# Patient Record
Sex: Female | Born: 1945 | Race: White | Hispanic: No | Smoking: Never smoker
Health system: Southern US, Community
[De-identification: ages and names within clinical notes are randomized; demographics above are authoritative.]

## PROBLEM LIST (undated history)

## (undated) DIAGNOSIS — M810 Age-related osteoporosis without current pathological fracture: Secondary | ICD-10-CM

## (undated) DIAGNOSIS — R87619 Unspecified abnormal cytological findings in specimens from cervix uteri: Secondary | ICD-10-CM

## (undated) DIAGNOSIS — N952 Postmenopausal atrophic vaginitis: Secondary | ICD-10-CM

## (undated) DIAGNOSIS — B009 Herpesviral infection, unspecified: Secondary | ICD-10-CM

## (undated) HISTORY — DX: Postmenopausal atrophic vaginitis: N95.2

## (undated) HISTORY — DX: Herpesviral infection, unspecified: B00.9

## (undated) HISTORY — DX: Age-related osteoporosis without current pathological fracture: M81.0

## (undated) HISTORY — DX: Unspecified abnormal cytological findings in specimens from cervix uteri: R87.619

## (undated) HISTORY — PX: ANKLE ARTHROSCOPY: SHX545

---

## 2000-01-29 ENCOUNTER — Ambulatory Visit (HOSPITAL_BASED_OUTPATIENT_CLINIC_OR_DEPARTMENT_OTHER): Admission: RE | Admit: 2000-01-29 | Discharge: 2000-01-29 | Payer: Self-pay | Admitting: *Deleted

## 2008-05-18 ENCOUNTER — Other Ambulatory Visit: Admission: RE | Admit: 2008-05-18 | Discharge: 2008-05-18 | Payer: Self-pay | Admitting: Obstetrics and Gynecology

## 2008-12-28 HISTORY — PX: COLONOSCOPY W/ POLYPECTOMY: SHX1380

## 2010-11-15 NOTE — Op Note (Signed)
[place]. [facility]  Patient:    Adriana Cook, Adriana Cook                      MRN: [id number] Proc. Date: 01/29/00 Attending:  Janet Berlin. Dan Humphreys, M.D.                           Operative Report  PREOPERATIVE DIAGNOSIS:  Basal cell carcinoma of the lower mid forehead.  POSTOPERATIVE DIAGNOSIS:  Basal cell carcinoma of the lower mid forehead.  OPERATION PERFORMED:  Excisional biopsy of approximately 8-9 mm basal cell carcinoma of the lower mid forehead.  SURGEON:  Janet Berlin. Dan Humphreys, M.D.  FIRST ASSISTANT:  None.  ANESTHESIA:  Local.  INDICATIONS:  [name] is a 65 year old woman who presents with a basal cell carcinoma just above the glabellar area of the lower mid forehead.  She is taken to the operating room at this time for excisional biopsy.  DESCRIPTION OF PROCEDURE:  The patient was brought back to the minor surgery operating room in the [facility].  She was placed on the table in a supine position.  The forehead area was sterilely prepped and draped.  I designed an elliptically shaped excision with a sterile surgical marking pen to include grossly clear margins around the lesion.  Soft tissues were then infiltrated with 1% Xylocaine containing epinephrine.  The lesion was then excised full thickness (including skin and underlying subcutaneous tissue).  I passed the specimen off the operative field.  Just prior to doing this, I had oriented the tissue for the pathologist by placing a suture in its right lateral margin.  After individually controlling bleeding points with the battery powered electrocautery, the wound edges were reapproximated with interrupted, buried Dermal sutures of 5-0 Vicryl.  I then used 6-0 monofilament with nylon in both running and vertical mattress fashions to provide for final epidermal leveling.  The wound was sterilely dressed.  The patient was given wound care instructions, and we will see her back in the  office early next week for a wound check, suture removal, and review of her final pathology report. DD:  01/29/00 TD:  01/30/00 Job: [id number]

## 2011-02-14 HISTORY — PX: CATARACT EXTRACTION: SUR2

## 2011-07-24 ENCOUNTER — Other Ambulatory Visit: Payer: Self-pay | Admitting: Dermatology

## 2012-04-12 HISTORY — PX: COLONOSCOPY W/ POLYPECTOMY: SHX1380

## 2012-04-12 LAB — HM COLONOSCOPY: HM Colonoscopy: ABNORMAL

## 2012-05-11 ENCOUNTER — Other Ambulatory Visit: Payer: Self-pay | Admitting: Dermatology

## 2013-03-01 DIAGNOSIS — Z961 Presence of intraocular lens: Secondary | ICD-10-CM | POA: Diagnosis not present

## 2013-03-01 DIAGNOSIS — H2510 Age-related nuclear cataract, unspecified eye: Secondary | ICD-10-CM | POA: Diagnosis not present

## 2013-03-01 DIAGNOSIS — H5231 Anisometropia: Secondary | ICD-10-CM | POA: Diagnosis not present

## 2013-03-01 DIAGNOSIS — H52229 Regular astigmatism, unspecified eye: Secondary | ICD-10-CM | POA: Diagnosis not present

## 2013-08-09 ENCOUNTER — Encounter: Payer: Self-pay | Admitting: Obstetrics and Gynecology

## 2013-08-12 ENCOUNTER — Ambulatory Visit: Payer: Self-pay | Admitting: Obstetrics and Gynecology

## 2013-08-12 ENCOUNTER — Telehealth: Payer: Self-pay | Admitting: Obstetrics and Gynecology

## 2013-08-12 NOTE — Telephone Encounter (Signed)
Thank you :)

## 2013-08-12 NOTE — Telephone Encounter (Signed)
Pt cancel for aex today . She had an family emergency I rescheduled her to 09/08/13 with PGrubb.

## 2013-09-02 DIAGNOSIS — L819 Disorder of pigmentation, unspecified: Secondary | ICD-10-CM | POA: Diagnosis not present

## 2013-09-02 DIAGNOSIS — D239 Other benign neoplasm of skin, unspecified: Secondary | ICD-10-CM | POA: Diagnosis not present

## 2013-09-02 DIAGNOSIS — L821 Other seborrheic keratosis: Secondary | ICD-10-CM | POA: Diagnosis not present

## 2013-09-02 DIAGNOSIS — D485 Neoplasm of uncertain behavior of skin: Secondary | ICD-10-CM | POA: Diagnosis not present

## 2013-09-02 DIAGNOSIS — Z85828 Personal history of other malignant neoplasm of skin: Secondary | ICD-10-CM | POA: Diagnosis not present

## 2013-09-05 DIAGNOSIS — D2370 Other benign neoplasm of skin of unspecified lower limb, including hip: Secondary | ICD-10-CM | POA: Diagnosis not present

## 2013-09-08 ENCOUNTER — Ambulatory Visit (INDEPENDENT_AMBULATORY_CARE_PROVIDER_SITE_OTHER): Payer: Medicare Other | Admitting: Nurse Practitioner

## 2013-09-08 ENCOUNTER — Encounter: Payer: Self-pay | Admitting: Nurse Practitioner

## 2013-09-08 VITALS — BP 120/82 | HR 68 | Ht 66.5 in | Wt 180.0 lb

## 2013-09-08 DIAGNOSIS — N952 Postmenopausal atrophic vaginitis: Secondary | ICD-10-CM

## 2013-09-08 DIAGNOSIS — Z124 Encounter for screening for malignant neoplasm of cervix: Secondary | ICD-10-CM | POA: Diagnosis not present

## 2013-09-08 DIAGNOSIS — E559 Vitamin D deficiency, unspecified: Secondary | ICD-10-CM | POA: Diagnosis not present

## 2013-09-08 DIAGNOSIS — E7800 Pure hypercholesterolemia, unspecified: Secondary | ICD-10-CM | POA: Insufficient documentation

## 2013-09-08 DIAGNOSIS — Z0000 Encounter for general adult medical examination without abnormal findings: Secondary | ICD-10-CM | POA: Diagnosis not present

## 2013-09-08 DIAGNOSIS — R82998 Other abnormal findings in urine: Secondary | ICD-10-CM

## 2013-09-08 DIAGNOSIS — Z01419 Encounter for gynecological examination (general) (routine) without abnormal findings: Secondary | ICD-10-CM

## 2013-09-08 DIAGNOSIS — R8290 Unspecified abnormal findings in urine: Secondary | ICD-10-CM

## 2013-09-08 DIAGNOSIS — Z860100 Personal history of colon polyps, unspecified: Secondary | ICD-10-CM | POA: Insufficient documentation

## 2013-09-08 DIAGNOSIS — Z780 Asymptomatic menopausal state: Secondary | ICD-10-CM | POA: Insufficient documentation

## 2013-09-08 LAB — POCT URINALYSIS DIPSTICK
Bilirubin, UA: NEGATIVE
Blood, UA: NEGATIVE
GLUCOSE UA: NEGATIVE
Ketones, UA: NEGATIVE
NITRITE UA: NEGATIVE
Protein, UA: NEGATIVE
UROBILINOGEN UA: NEGATIVE
pH, UA: 5

## 2013-09-08 LAB — COMPREHENSIVE METABOLIC PANEL
ALT: 35 U/L (ref 0–35)
AST: 27 U/L (ref 0–37)
Albumin: 4.1 g/dL (ref 3.5–5.2)
Alkaline Phosphatase: 95 U/L (ref 39–117)
BILIRUBIN TOTAL: 0.8 mg/dL (ref 0.2–1.2)
BUN: 14 mg/dL (ref 6–23)
CHLORIDE: 105 meq/L (ref 96–112)
CO2: 26 mEq/L (ref 19–32)
Calcium: 9 mg/dL (ref 8.4–10.5)
Creat: 0.65 mg/dL (ref 0.50–1.10)
Glucose, Bld: 99 mg/dL (ref 70–99)
Potassium: 4.4 mEq/L (ref 3.5–5.3)
SODIUM: 141 meq/L (ref 135–145)
TOTAL PROTEIN: 6.2 g/dL (ref 6.0–8.3)

## 2013-09-08 LAB — LIPID PANEL
Cholesterol: 203 mg/dL — ABNORMAL HIGH (ref 0–200)
HDL: 42 mg/dL (ref >39)
LDL Cholesterol: 143 mg/dL — ABNORMAL HIGH (ref 0–99)
Total CHOL/HDL Ratio: 4.8 Ratio
Triglycerides: 91 mg/dL (ref <150)
VLDL: 18 mg/dL (ref 0–40)

## 2013-09-08 LAB — TSH: TSH: 1.89 u[IU]/mL (ref 0.350–4.500)

## 2013-09-08 NOTE — Progress Notes (Signed)
Patient ID: Adriana Cook, female   DOB: 1945/07/23, 68 y.o.   MRN: [id number] 68 y.o. G2P2 Widowed Caucasian Fe here for annual exam.  Currently in a relationship for 15 years. No STD risk.  Still has vaginal dryness and using OTC lubrication. No new health concerns. Denies urinary symptoms.  Patient's last menstrual period was 06/30/1996.          Sexually active: no  The current method of family planning is post menopausal status.    Exercising: yes  Gym/ health club routine includes workout with trainer twice weekly and tennis. Smoker:  no  Health Maintenance: Pap:  06/11/09, WNL MMG:  02/09/12, Bi-Rads 1:  negative Colonoscopy:  04/12/12, tubular adenoma, repeat 5 years BMD:  2009 - normal TDaP:  07/23/2011 Labs:  HB: 15.4 Urine:  1+ leuk's, pH 5.0   reports that she has never smoked. She has never used smokeless tobacco. She reports that she does not drink alcohol or use illicit drugs.  Past Medical History  Diagnosis Date  . Atrophic vaginitis     Past Surgical History  Procedure Laterality Date  . Cesarean section  09/25/1980  . Colonoscopy w/ polypectomy  12/2008    high grade gladular dysplasia recheck in 3 years  . Colonoscopy w/ polypectomy  04/12/12    polyp X 4 tubular adenoma - recheck in 5 years.  . Ankle arthroscopy Right 2008 ?    Current Outpatient Prescriptions  Medication Sig Dispense Refill  . glucosamine-chondroitin 500-400 MG tablet Take 1 tablet by mouth daily.      . Multiple Vitamin (MULTI-VITAMIN DAILY PO) Take by mouth daily.      . Multiple Vitamins-Minerals (PRESERVISION AREDS 2 PO) Take by mouth.      . Omega-3 Fatty Acids (FISH OIL PO) Take by mouth daily.       No current facility-administered medications for this visit.    Family History  Problem Relation Age of Onset  . Breast cancer Mother   . Cancer Mother     Kidney Cancer  . Cancer Father     Lung Cancer  . Osteoporosis Sister     ROS:  Pertinent items are noted in HPI.   Otherwise, a comprehensive ROS was negative.  Exam:   BP 120/82  Pulse 68  Ht 5' 6.5" (1.689 m)  Wt 180 lb (81.647 kg)  BMI 28.62 kg/m2  LMP 06/30/1996 Height: 5' 6.5" (168.9 cm)  Ht Readings from Last 3 Encounters:  09/08/13 5' 6.5" (1.689 m)    General appearance: alert, cooperative and appears stated age Head: Normocephalic, without obvious abnormality, atraumatic Neck: no adenopathy, supple, symmetrical, trachea midline and thyroid normal to inspection and palpation Lungs: clear to auscultation bilaterally Breasts: normal appearance, no masses or tenderness Heart: regular rate and rhythm Abdomen: soft, non-tender; no masses,  no organomegaly Extremities: extremities normal, atraumatic, no cyanosis or edema Skin: Skin color, texture, turgor normal. No rashes or lesions Lymph nodes: Cervical, supraclavicular, and axillary nodes normal. No abnormal inguinal nodes palpated Neurologic: Grossly normal   Pelvic: External genitalia:  no lesions              Urethra:  normal appearing urethra with no masses, tenderness or lesions              Bartholin's and Skene's: normal                 Vagina: some atrophic changes appearing vagina with pale color and discharge, no lesions  Cervix: anteverted              Pap taken: yes Bimanual Exam:  Uterus:  normal size, contour, position, consistency, mobility, non-tender              Adnexa: no mass, fullness, tenderness               Rectovaginal: Confirms               Anus:  normal sphincter tone, no lesions  A:  Well Woman with normal exam  Postmenopausal  no HRT  History of elevated cholesterol  History of Vit D deficiency - now on OTC  Atrophic vaginitis  R/O UTI  History of colon polyps and tubular adenoma  P:   Pap smear as per guidelines   Mammogram is due and patient to schedule  Will follow with labs  Will follow with urine C & S - no treatment started  Counseled on breast self exam, mammography screening,  adequate intake of calcium and vitamin D, diet and exercise, Kegel's exercises return annually or prn  An After Visit Summary was printed and given to the patient.

## 2013-09-08 NOTE — Patient Instructions (Signed)

## 2013-09-09 LAB — VITAMIN D 25 HYDROXY (VIT D DEFICIENCY, FRACTURES): VIT D 25 HYDROXY: 43 ng/mL (ref 30–89)

## 2013-09-09 LAB — URINE CULTURE
Colony Count: NO GROWTH
Organism ID, Bacteria: NO GROWTH

## 2013-09-09 LAB — HEMOGLOBIN, FINGERSTICK: HEMOGLOBIN, FINGERSTICK: 15.4 g/dL (ref 12.0–16.0)

## 2013-09-12 LAB — IPS PAP SMEAR ONLY

## 2013-09-13 NOTE — Progress Notes (Signed)
Encounter reviewed by Dr. Eino Whitner Silva.  

## 2014-05-01 ENCOUNTER — Encounter: Payer: Self-pay | Admitting: Nurse Practitioner

## 2014-09-19 ENCOUNTER — Ambulatory Visit: Payer: Medicare Other | Admitting: Nurse Practitioner

## 2014-11-28 ENCOUNTER — Ambulatory Visit: Payer: Medicare Other | Admitting: Nurse Practitioner

## 2014-11-30 ENCOUNTER — Ambulatory Visit (INDEPENDENT_AMBULATORY_CARE_PROVIDER_SITE_OTHER): Payer: Medicare Other | Admitting: Nurse Practitioner

## 2014-11-30 ENCOUNTER — Encounter: Payer: Self-pay | Admitting: Nurse Practitioner

## 2014-11-30 VITALS — BP 136/80 | HR 80 | Resp 18 | Ht 66.0 in | Wt 182.0 lb

## 2014-11-30 DIAGNOSIS — R8290 Unspecified abnormal findings in urine: Secondary | ICD-10-CM

## 2014-11-30 DIAGNOSIS — Z0000 Encounter for general adult medical examination without abnormal findings: Secondary | ICD-10-CM

## 2014-11-30 DIAGNOSIS — E559 Vitamin D deficiency, unspecified: Secondary | ICD-10-CM | POA: Diagnosis not present

## 2014-11-30 DIAGNOSIS — E7800 Pure hypercholesterolemia, unspecified: Secondary | ICD-10-CM

## 2014-11-30 DIAGNOSIS — R8299 Other abnormal findings in urine: Secondary | ICD-10-CM | POA: Diagnosis not present

## 2014-11-30 DIAGNOSIS — Z01419 Encounter for gynecological examination (general) (routine) without abnormal findings: Secondary | ICD-10-CM | POA: Diagnosis not present

## 2014-11-30 DIAGNOSIS — Z124 Encounter for screening for malignant neoplasm of cervix: Secondary | ICD-10-CM

## 2014-11-30 LAB — COMPREHENSIVE METABOLIC PANEL
ALBUMIN: 4.2 g/dL (ref 3.5–5.2)
ALT: 38 U/L — AB (ref 0–35)
AST: 30 U/L (ref 0–37)
Alkaline Phosphatase: 109 U/L (ref 39–117)
BUN: 12 mg/dL (ref 6–23)
CO2: 25 mEq/L (ref 19–32)
Calcium: 9.5 mg/dL (ref 8.4–10.5)
Chloride: 105 mEq/L (ref 96–112)
Creat: 0.72 mg/dL (ref 0.50–1.10)
GLUCOSE: 106 mg/dL — AB (ref 70–99)
Potassium: 4.6 mEq/L (ref 3.5–5.3)
Sodium: 141 mEq/L (ref 135–145)
TOTAL PROTEIN: 6.5 g/dL (ref 6.0–8.3)
Total Bilirubin: 0.8 mg/dL (ref 0.2–1.2)

## 2014-11-30 LAB — LIPID PANEL
CHOL/HDL RATIO: 4.8 ratio
CHOLESTEROL: 208 mg/dL — AB (ref 0–200)
HDL: 43 mg/dL — AB (ref >=46)
LDL Cholesterol: 144 mg/dL — ABNORMAL HIGH (ref 0–99)
TRIGLYCERIDES: 107 mg/dL (ref <150)
VLDL: 21 mg/dL (ref 0–40)

## 2014-11-30 LAB — TSH: TSH: 2.285 u[IU]/mL (ref 0.350–4.500)

## 2014-11-30 LAB — POCT URINALYSIS DIPSTICK
BILIRUBIN UA: NEGATIVE
Blood, UA: NEGATIVE
GLUCOSE UA: NEGATIVE
Ketones, UA: NEGATIVE
NITRITE UA: NEGATIVE
PH UA: 6
PROTEIN UA: NEGATIVE
UROBILINOGEN UA: NEGATIVE

## 2014-11-30 NOTE — Progress Notes (Signed)
69 y.o. G2P2 Widowed Caucasian Fe here for annual exam.  No new health problems.  Same partner but rarely SA.  Patient's last menstrual period was 06/30/1996.          Sexually active: No.  The current method of family planning is post menopausal status.    Exercising: Yes.    trainer 2 x weekly, tennis 1 x weekly, walk 2 x weekly Smoker:  no  Health Maintenance: Pap: 09/08/13 Neg MMG:  02/09/12 BIRADS1:Neg Self Breast Exam: yes, monthly  Colonoscopy:  2013 tubular adenoma - repeat 5 years  BMD:   2009 - normal  TDaP:  07/23/2011 Shingles:  11/ 2015 Prevnar: not yet Labs: Will do here Urine: trace WBC   reports that she has never smoked. She has never used smokeless tobacco. She reports that she drinks about 0.6 oz of alcohol per week. She reports that she does not use illicit drugs.  Past Medical History  Diagnosis Date  . Atrophic vaginitis     Past Surgical History  Procedure Laterality Date  . Cesarean section  09/25/1980  . Colonoscopy w/ polypectomy  12/2008    high grade gladular dysplasia recheck in 3 years  . Colonoscopy w/ polypectomy  04/12/12    polyp X 4 tubular adenoma - recheck in 5 years.  . Ankle arthroscopy Right 2008 ?    Current Outpatient Prescriptions  Medication Sig Dispense Refill  . Multiple Vitamin (MULTI-VITAMIN DAILY PO) Take by mouth daily.    . Multiple Vitamins-Minerals (PRESERVISION AREDS 2 PO) Take by mouth.     No current facility-administered medications for this visit.    Family History  Problem Relation Age of Onset  . Breast cancer Mother   . Cancer Mother     Kidney Cancer  . Cancer Father     Lung Cancer  . Osteoporosis Sister     ROS:  Pertinent items are noted in HPI.  Otherwise, a comprehensive ROS was negative.  Exam:   BP 136/80 mmHg  Pulse 80  Resp 18  Ht 5\' 6"  (1.676 m)  Wt 182 lb (82.555 kg)  BMI 29.39 kg/m2  LMP 06/30/1996 Height: 5\' 6"  (167.6 cm) Ht Readings from Last 3 Encounters:  11/30/14 5\' 6"  (1.676 m)   09/08/13 5' 6.5" (1.689 m)    General appearance: alert, cooperative and appears stated age Head: Normocephalic, without obvious abnormality, atraumatic Neck: no adenopathy, supple, symmetrical, trachea midline and thyroid normal to inspection and palpation Lungs: clear to auscultation bilaterally Breasts: normal appearance, no masses or tenderness Heart: regular rate and rhythm Abdomen: soft, non-tender; no masses,  no organomegaly Extremities: extremities normal, atraumatic, no cyanosis or edema Skin: Skin color, texture, turgor normal. No rashes or lesions Lymph nodes: Cervical, supraclavicular, and axillary nodes normal. No abnormal inguinal nodes palpated Neurologic: Grossly normal   Pelvic: External genitalia:  no lesions              Urethra:  normal appearing urethra with no masses, tenderness or lesions              Bartholin's and Skene's: normal                 Vagina: normal appearing vagina with normal color and discharge, no lesions              Cervix: anteverted              Pap taken: No. Bimanual Exam:  Uterus:  normal size, contour, position,  consistency, mobility, non-tender              Adnexa: no mass, fullness, tenderness               Rectovaginal: Confirms               Anus:  normal sphincter tone, no lesions  Chaperone present: N  A:  Well Woman with normal exam  Postmenopausal no HRT History of elevated cholesterol History of Vit D deficiency - now on OTC Atrophic vaginitis R/O UTI History of colon polyps and tubular adenoma    P:   Reviewed health and wellness pertinent to exam  Pap smear as above  Mammogram is due now and is scheduled  Will follow with urine C&S - no symptoms  She will see PCP and ask about Prevnar 13  Counseled on breast self exam, mammography screening, adequate intake of calcium and vitamin D, diet and exercise return annually or prn  An After Visit Summary  was printed and given to the patient.

## 2014-11-30 NOTE — Patient Instructions (Addendum)

## 2014-12-01 LAB — VITAMIN D 25 HYDROXY (VIT D DEFICIENCY, FRACTURES): Vit D, 25-Hydroxy: 22 ng/mL — ABNORMAL LOW (ref 30–100)

## 2014-12-03 LAB — URINE CULTURE: Colony Count: 50000

## 2014-12-03 NOTE — Progress Notes (Signed)
Encounter reviewed by Dr. Jaquez Farrington Silva.  

## 2014-12-04 ENCOUNTER — Other Ambulatory Visit: Payer: Self-pay | Admitting: Nurse Practitioner

## 2014-12-04 DIAGNOSIS — R899 Unspecified abnormal finding in specimens from other organs, systems and tissues: Secondary | ICD-10-CM

## 2014-12-04 DIAGNOSIS — N390 Urinary tract infection, site not specified: Secondary | ICD-10-CM

## 2014-12-04 MED ORDER — NITROFURANTOIN MONOHYD MACRO 100 MG PO CAPS: 100.0000 mg | ORAL_CAPSULE | Freq: Two times a day (BID) | ORAL | Status: DC

## 2014-12-18 ENCOUNTER — Ambulatory Visit (INDEPENDENT_AMBULATORY_CARE_PROVIDER_SITE_OTHER): Payer: Medicare Other | Admitting: *Deleted

## 2014-12-18 DIAGNOSIS — R899 Unspecified abnormal finding in specimens from other organs, systems and tissues: Secondary | ICD-10-CM

## 2014-12-18 DIAGNOSIS — N390 Urinary tract infection, site not specified: Secondary | ICD-10-CM | POA: Diagnosis not present

## 2014-12-18 LAB — HEPATIC FUNCTION PANEL
ALT: 41 U/L — ABNORMAL HIGH (ref 0–35)
AST: 26 U/L (ref 0–37)
Albumin: 4.2 g/dL (ref 3.5–5.2)
Alkaline Phosphatase: 105 U/L (ref 39–117)
BILIRUBIN DIRECT: 0.1 mg/dL (ref 0.0–0.3)
Indirect Bilirubin: 0.6 mg/dL (ref 0.2–1.2)
Total Bilirubin: 0.7 mg/dL (ref 0.2–1.2)
Total Protein: 6.4 g/dL (ref 6.0–8.3)

## 2014-12-18 NOTE — Progress Notes (Signed)
Patient is here for urine toc for uti treated 12/04/14  Patient has completed antibiotics and is feeling fine she didn't have any symptoms. Urine drawn up for culture and sent to lab Hepatic function panel also drawn Routed to provider for review, encounter closed.

## 2014-12-19 ENCOUNTER — Telehealth: Payer: Self-pay

## 2014-12-19 NOTE — Telephone Encounter (Signed)
-----   Message from Kem Boroughs, [place] sent at 12/19/2014  8:00 AM EDT ----- Please let pt. Know that the liver test is still elevated.  Not very high but still needs follow up.  She needs to see PCP or GI - we can help make that apt. If needed.

## 2014-12-19 NOTE — Telephone Encounter (Signed)
Spoke with patient. Advised of message as seen below from Milford Cage, [place]. Patient is scheduled to see her PCP tomorrow. Will come by office to pick up recent results to take with her to that appointment. Lab results printed and to the front desk for pick up.  Routing to provider for final review. Patient agreeable to disposition. Will close encounter.

## 2014-12-20 DIAGNOSIS — B009 Herpesviral infection, unspecified: Secondary | ICD-10-CM | POA: Diagnosis not present

## 2014-12-20 DIAGNOSIS — R7989 Other specified abnormal findings of blood chemistry: Secondary | ICD-10-CM | POA: Diagnosis not present

## 2014-12-20 DIAGNOSIS — R05 Cough: Secondary | ICD-10-CM | POA: Diagnosis not present

## 2014-12-20 DIAGNOSIS — R748 Abnormal levels of other serum enzymes: Secondary | ICD-10-CM | POA: Diagnosis not present

## 2014-12-20 DIAGNOSIS — E663 Overweight: Secondary | ICD-10-CM | POA: Diagnosis not present

## 2014-12-20 DIAGNOSIS — Z23 Encounter for immunization: Secondary | ICD-10-CM | POA: Diagnosis not present

## 2014-12-20 LAB — URINE CULTURE
COLONY COUNT: NO GROWTH
ORGANISM ID, BACTERIA: NO GROWTH

## 2015-01-11 DIAGNOSIS — R05 Cough: Secondary | ICD-10-CM | POA: Diagnosis not present

## 2015-05-17 DIAGNOSIS — Z1231 Encounter for screening mammogram for malignant neoplasm of breast: Secondary | ICD-10-CM | POA: Diagnosis not present

## 2015-05-17 DIAGNOSIS — Z803 Family history of malignant neoplasm of breast: Secondary | ICD-10-CM | POA: Diagnosis not present

## 2015-05-21 DIAGNOSIS — Z1231 Encounter for screening mammogram for malignant neoplasm of breast: Secondary | ICD-10-CM | POA: Diagnosis not present

## 2015-05-21 DIAGNOSIS — Z803 Family history of malignant neoplasm of breast: Secondary | ICD-10-CM | POA: Diagnosis not present

## 2015-05-21 DIAGNOSIS — N6002 Solitary cyst of left breast: Secondary | ICD-10-CM | POA: Diagnosis not present

## 2015-12-05 ENCOUNTER — Encounter: Payer: Self-pay | Admitting: Nurse Practitioner

## 2015-12-05 ENCOUNTER — Ambulatory Visit (INDEPENDENT_AMBULATORY_CARE_PROVIDER_SITE_OTHER): Payer: Medicare Other | Admitting: Nurse Practitioner

## 2015-12-05 ENCOUNTER — Other Ambulatory Visit: Payer: Self-pay | Admitting: Nurse Practitioner

## 2015-12-05 ENCOUNTER — Ambulatory Visit: Payer: Medicare Other | Admitting: Nurse Practitioner

## 2015-12-05 VITALS — BP 112/70 | HR 60 | Resp 18 | Ht 66.0 in | Wt 178.0 lb

## 2015-12-05 DIAGNOSIS — R7309 Other abnormal glucose: Secondary | ICD-10-CM | POA: Diagnosis not present

## 2015-12-05 DIAGNOSIS — Z0000 Encounter for general adult medical examination without abnormal findings: Secondary | ICD-10-CM

## 2015-12-05 DIAGNOSIS — E559 Vitamin D deficiency, unspecified: Secondary | ICD-10-CM

## 2015-12-05 DIAGNOSIS — Z01419 Encounter for gynecological examination (general) (routine) without abnormal findings: Secondary | ICD-10-CM | POA: Diagnosis not present

## 2015-12-05 DIAGNOSIS — E7800 Pure hypercholesterolemia, unspecified: Secondary | ICD-10-CM | POA: Diagnosis not present

## 2015-12-05 DIAGNOSIS — Z205 Contact with and (suspected) exposure to viral hepatitis: Secondary | ICD-10-CM | POA: Diagnosis not present

## 2015-12-05 DIAGNOSIS — Z20828 Contact with and (suspected) exposure to other viral communicable diseases: Secondary | ICD-10-CM | POA: Diagnosis not present

## 2015-12-05 DIAGNOSIS — R899 Unspecified abnormal finding in specimens from other organs, systems and tissues: Secondary | ICD-10-CM

## 2015-12-05 LAB — CBC
HCT: 46.5 % — ABNORMAL HIGH (ref 35.0–45.0)
Hemoglobin: 15.6 g/dL — ABNORMAL HIGH (ref 11.7–15.5)
MCH: 30.1 pg (ref 27.0–33.0)
MCHC: 33.5 g/dL (ref 32.0–36.0)
MCV: 89.6 fL (ref 80.0–100.0)
MPV: 10.7 fL (ref 7.5–12.5)
PLATELETS: 261 10*3/uL (ref 140–400)
RBC: 5.19 MIL/uL — AB (ref 3.80–5.10)
RDW: 13.3 % (ref 11.0–15.0)
WBC: 7.8 10*3/uL (ref 3.8–10.8)

## 2015-12-05 LAB — COMPREHENSIVE METABOLIC PANEL
ALT: 40 U/L — ABNORMAL HIGH (ref 6–29)
AST: 29 U/L (ref 10–35)
Albumin: 4 g/dL (ref 3.6–5.1)
Alkaline Phosphatase: 115 U/L (ref 33–130)
BILIRUBIN TOTAL: 0.8 mg/dL (ref 0.2–1.2)
BUN: 11 mg/dL (ref 7–25)
CO2: 23 mmol/L (ref 20–31)
CREATININE: 0.66 mg/dL (ref 0.50–0.99)
Calcium: 9.1 mg/dL (ref 8.6–10.4)
Chloride: 105 mmol/L (ref 98–110)
GLUCOSE: 104 mg/dL — AB (ref 65–99)
Potassium: 4.4 mmol/L (ref 3.5–5.3)
SODIUM: 141 mmol/L (ref 135–146)
Total Protein: 6.1 g/dL (ref 6.1–8.1)

## 2015-12-05 LAB — LIPID PANEL
CHOLESTEROL: 203 mg/dL — AB (ref 125–200)
HDL: 44 mg/dL — AB (ref >=46)
LDL Cholesterol: 139 mg/dL — ABNORMAL HIGH (ref <130)
TRIGLYCERIDES: 102 mg/dL (ref <150)
Total CHOL/HDL Ratio: 4.6 Ratio (ref <=5.0)
VLDL: 20 mg/dL (ref <30)

## 2015-12-05 LAB — TSH: TSH: 3.06 m[IU]/L

## 2015-12-05 NOTE — Patient Instructions (Addendum)

## 2015-12-05 NOTE — Progress Notes (Signed)
70 y.o. G2P2 Widowed  Caucasian Fe here for annual exam.  No new health problems.  Same partner of 17 yrs but not SA.  Patient's last menstrual period was 06/30/1996.          Sexually active: No.  The current method of family planning is post menopausal status.    Exercising: Yes.    work out with trainer, tennis, walking Smoker:  no  Health Maintenance: Pap:  09/08/13 Neg MMG:  05/21/15 Left [place] BIRADS2:Benign  Colonoscopy:  04/12/2012 tubular adenoma- f/u 5 years  BMD:   2009 Normal ? osteopenia TDaP:  07/23/2011  Shingles: 2016 Pneumonia: 11/2014 Hep C: done today Labs: done   reports that she has never smoked. She has never used smokeless tobacco. She reports that she drinks about 0.6 oz of alcohol per week. She reports that she does not use illicit drugs.  Past Medical History  Diagnosis Date  . Atrophic vaginitis     Past Surgical History  Procedure Laterality Date  . Cesarean section  09/25/1980  . Colonoscopy w/ polypectomy  12/2008    high grade gladular dysplasia recheck in 3 years  . Colonoscopy w/ polypectomy  04/12/12    polyp X 4 tubular adenoma - recheck in 5 years.  . Ankle arthroscopy Right 2008 ?  Marland Kitchen Cataract extraction Right 02/14/11    Current Outpatient Prescriptions  Medication Sig Dispense Refill  . Multiple Vitamin (MULTI-VITAMIN DAILY PO) Take by mouth daily.    . Multiple Vitamins-Minerals (PRESERVISION AREDS 2 PO) Take by mouth.    Marland Kitchen omeprazole (PRILOSEC) 20 MG capsule TAKE 1 CAPSULE EVERY MORNING 30 MINUTES PRIOR TO BREAKFAST  1   No current facility-administered medications for this visit.    Family History  Problem Relation Age of Onset  . Breast cancer Mother   . Cancer Mother     Kidney Cancer  . Cancer Father     Lung Cancer  . Osteoporosis Sister   . Breast cancer Sister     ROS:  Pertinent items are noted in HPI.  Otherwise, a comprehensive ROS was negative.  Exam:   BP 112/70 mmHg  Pulse 60  Resp 18  Ht 5\' 6"  (1.676 m)  Wt  178 lb (80.74 kg)  BMI 28.74 kg/m2  LMP 06/30/1996 Height: 5\' 6"  (167.6 cm) Ht Readings from Last 3 Encounters:  12/05/15 5\' 6"  (1.676 m)  12/18/14 5\' 6"  (1.676 m)  11/30/14 5\' 6"  (1.676 m)    General appearance: alert, cooperative and appears stated age Head: Normocephalic, without obvious abnormality, atraumatic Neck: no adenopathy, supple, symmetrical, trachea midline and thyroid normal to inspection and palpation Lungs: clear to auscultation bilaterally Breasts: normal appearance, no masses or tenderness Heart: regular rate and rhythm Abdomen: soft, non-tender; no masses,  no organomegaly Extremities: extremities normal, atraumatic, no cyanosis or edema Skin: Skin color, texture, turgor normal. No rashes or lesions Lymph nodes: Cervical, supraclavicular, and axillary nodes normal. No abnormal inguinal nodes palpated Neurologic: Grossly normal   Pelvic: External genitalia:  no lesions              Urethra:  normal appearing urethra with no masses, tenderness or lesions              Bartholin's and Skene's: normal                 Vagina: normal appearing vagina with normal color and discharge, no lesions  Cervix: anteverted              Pap taken: No. Bimanual Exam:  Uterus:  normal size, contour, position, consistency, mobility, non-tender              Adnexa: no mass, fullness, tenderness               Rectovaginal: Confirms               Anus:  normal sphincter tone, no lesions  Chaperone present: yes  A:  Well Woman with normal exam  Postmenopausal no HRT History of elevated cholesterol and abnormal liver study History of Vit D deficiency - now on OTC Atrophic vaginitis History of colon polyps and tubular adenoma - for repeat 03/2017   P:   Reviewed health and wellness pertinent to exam  Pap smear as above  Mammogram is due 04/2016  Note is [facility]ed to [facility] for BMD - she will schedule  Will follow with  labs and abnormal liver study  Counseled on breast self exam, mammography screening, adequate intake of calcium and vitamin D, diet and exercise return annually or prn  An After Visit Summary was printed and given to the patient.

## 2015-12-06 LAB — HEPATITIS C ANTIBODY: HCV Ab: NEGATIVE

## 2015-12-06 LAB — VITAMIN D 25 HYDROXY (VIT D DEFICIENCY, FRACTURES): Vit D, 25-Hydroxy: 21 ng/mL — ABNORMAL LOW (ref 30–100)

## 2015-12-07 LAB — HEMOGLOBIN A1C
Hgb A1c MFr Bld: 5.5 % (ref <5.7)
Mean Plasma Glucose: 111 mg/dL

## 2015-12-08 NOTE — Progress Notes (Signed)
Encounter reviewed Adriana Yanni, MD   

## 2016-11-14 DIAGNOSIS — H25812 Combined forms of age-related cataract, left eye: Secondary | ICD-10-CM | POA: Diagnosis not present

## 2016-11-14 DIAGNOSIS — H52223 Regular astigmatism, bilateral: Secondary | ICD-10-CM | POA: Diagnosis not present

## 2016-11-14 DIAGNOSIS — H5211 Myopia, right eye: Secondary | ICD-10-CM | POA: Diagnosis not present

## 2016-11-14 DIAGNOSIS — D3132 Benign neoplasm of left choroid: Secondary | ICD-10-CM | POA: Diagnosis not present

## 2016-11-14 DIAGNOSIS — H02821 Cysts of right upper eyelid: Secondary | ICD-10-CM | POA: Diagnosis not present

## 2016-11-14 DIAGNOSIS — H35363 Drusen (degenerative) of macula, bilateral: Secondary | ICD-10-CM | POA: Diagnosis not present

## 2016-11-14 DIAGNOSIS — Z961 Presence of intraocular lens: Secondary | ICD-10-CM | POA: Diagnosis not present

## 2016-11-14 DIAGNOSIS — H5202 Hypermetropia, left eye: Secondary | ICD-10-CM | POA: Diagnosis not present

## 2016-12-05 ENCOUNTER — Ambulatory Visit: Payer: Medicare Other | Admitting: Nurse Practitioner

## 2016-12-08 ENCOUNTER — Ambulatory Visit: Payer: Medicare Other | Admitting: Nurse Practitioner

## 2016-12-23 DIAGNOSIS — C44319 Basal cell carcinoma of skin of other parts of face: Secondary | ICD-10-CM | POA: Diagnosis not present

## 2016-12-23 DIAGNOSIS — D485 Neoplasm of uncertain behavior of skin: Secondary | ICD-10-CM | POA: Diagnosis not present

## 2016-12-23 DIAGNOSIS — L570 Actinic keratosis: Secondary | ICD-10-CM | POA: Diagnosis not present

## 2016-12-30 ENCOUNTER — Telehealth: Payer: Self-pay | Admitting: Nurse Practitioner

## 2016-12-30 NOTE — Telephone Encounter (Signed)
Left message regarding upcoming appointment has been canceled and needs to be rescheduled. °

## 2017-01-28 ENCOUNTER — Ambulatory Visit: Payer: Medicare Other | Admitting: Nurse Practitioner

## 2017-03-04 DIAGNOSIS — C44319 Basal cell carcinoma of skin of other parts of face: Secondary | ICD-10-CM | POA: Diagnosis not present

## 2017-05-07 DIAGNOSIS — Z800 Family history of malignant neoplasm of digestive organs: Secondary | ICD-10-CM | POA: Diagnosis not present

## 2017-05-07 DIAGNOSIS — R748 Abnormal levels of other serum enzymes: Secondary | ICD-10-CM | POA: Diagnosis not present

## 2017-05-07 DIAGNOSIS — K5730 Diverticulosis of large intestine without perforation or abscess without bleeding: Secondary | ICD-10-CM | POA: Diagnosis not present

## 2017-05-07 DIAGNOSIS — R635 Abnormal weight gain: Secondary | ICD-10-CM | POA: Diagnosis not present

## 2017-05-07 DIAGNOSIS — K219 Gastro-esophageal reflux disease without esophagitis: Secondary | ICD-10-CM | POA: Diagnosis not present

## 2017-05-07 DIAGNOSIS — Z86010 Personal history of colonic polyps: Secondary | ICD-10-CM | POA: Diagnosis not present

## 2017-05-07 DIAGNOSIS — Z1211 Encounter for screening for malignant neoplasm of colon: Secondary | ICD-10-CM | POA: Diagnosis not present

## 2017-05-12 DIAGNOSIS — R740 Nonspecific elevation of levels of transaminase and lactic acid dehydrogenase [LDH]: Secondary | ICD-10-CM | POA: Diagnosis not present

## 2017-05-18 DIAGNOSIS — K621 Rectal polyp: Secondary | ICD-10-CM | POA: Diagnosis not present

## 2017-05-18 DIAGNOSIS — Z800 Family history of malignant neoplasm of digestive organs: Secondary | ICD-10-CM | POA: Diagnosis not present

## 2017-05-18 DIAGNOSIS — Z1211 Encounter for screening for malignant neoplasm of colon: Secondary | ICD-10-CM | POA: Diagnosis not present

## 2017-05-18 DIAGNOSIS — K5730 Diverticulosis of large intestine without perforation or abscess without bleeding: Secondary | ICD-10-CM | POA: Diagnosis not present

## 2017-05-18 DIAGNOSIS — D128 Benign neoplasm of rectum: Secondary | ICD-10-CM | POA: Diagnosis not present

## 2017-05-28 DIAGNOSIS — Z23 Encounter for immunization: Secondary | ICD-10-CM | POA: Diagnosis not present

## 2017-06-02 DIAGNOSIS — Z86010 Personal history of colonic polyps: Secondary | ICD-10-CM | POA: Diagnosis not present

## 2017-06-02 DIAGNOSIS — K5730 Diverticulosis of large intestine without perforation or abscess without bleeding: Secondary | ICD-10-CM | POA: Diagnosis not present

## 2017-06-02 DIAGNOSIS — K219 Gastro-esophageal reflux disease without esophagitis: Secondary | ICD-10-CM | POA: Diagnosis not present

## 2017-06-02 DIAGNOSIS — R748 Abnormal levels of other serum enzymes: Secondary | ICD-10-CM | POA: Diagnosis not present

## 2017-06-17 ENCOUNTER — Other Ambulatory Visit: Payer: Self-pay | Admitting: Gastroenterology

## 2017-06-17 DIAGNOSIS — R7989 Other specified abnormal findings of blood chemistry: Secondary | ICD-10-CM

## 2017-06-17 DIAGNOSIS — R945 Abnormal results of liver function studies: Principal | ICD-10-CM

## 2017-07-14 ENCOUNTER — Ambulatory Visit
Admission: RE | Admit: 2017-07-14 | Discharge: 2017-07-14 | Disposition: A | Discharge status: Home / Self Care | Payer: Medicare Other | Source: Ambulatory Visit | Attending: Gastroenterology | Admitting: Gastroenterology

## 2017-07-14 DIAGNOSIS — R945 Abnormal results of liver function studies: Principal | ICD-10-CM

## 2017-07-14 DIAGNOSIS — R7989 Other specified abnormal findings of blood chemistry: Secondary | ICD-10-CM

## 2017-07-21 DIAGNOSIS — L821 Other seborrheic keratosis: Secondary | ICD-10-CM | POA: Diagnosis not present

## 2017-07-21 DIAGNOSIS — Z411 Encounter for cosmetic surgery: Secondary | ICD-10-CM | POA: Diagnosis not present

## 2017-07-21 DIAGNOSIS — D2111 Benign neoplasm of connective and other soft tissue of right upper limb, including shoulder: Secondary | ICD-10-CM | POA: Diagnosis not present

## 2017-07-21 DIAGNOSIS — D2261 Melanocytic nevi of right upper limb, including shoulder: Secondary | ICD-10-CM | POA: Diagnosis not present

## 2017-07-21 DIAGNOSIS — Z23 Encounter for immunization: Secondary | ICD-10-CM | POA: Diagnosis not present

## 2017-07-21 DIAGNOSIS — D485 Neoplasm of uncertain behavior of skin: Secondary | ICD-10-CM | POA: Diagnosis not present

## 2017-07-21 DIAGNOSIS — Z85828 Personal history of other malignant neoplasm of skin: Secondary | ICD-10-CM | POA: Diagnosis not present

## 2017-07-21 DIAGNOSIS — D2271 Melanocytic nevi of right lower limb, including hip: Secondary | ICD-10-CM | POA: Diagnosis not present

## 2017-10-05 DIAGNOSIS — M7120 Synovial cyst of popliteal space [Baker], unspecified knee: Secondary | ICD-10-CM | POA: Diagnosis not present

## 2017-12-01 ENCOUNTER — Telehealth: Payer: Self-pay | Admitting: Obstetrics and Gynecology

## 2017-12-01 NOTE — Telephone Encounter (Signed)
Routing to Dr.Silva for review as patient has not been seen since 2017. Please advise.

## 2017-12-01 NOTE — Telephone Encounter (Signed)
Patient has mammogram scheduled for Friday 12/04/17 and also needs BMD done. Would like to know if she can get the order for the same visit or not. Last annual 12/05/15. Next annual scheduled for 12/09/17.

## 2017-12-02 NOTE — Telephone Encounter (Signed)
Spoke with patient. Aex moved to 12/03/2017. Patient is agreeable to date and time. Will close encounter.

## 2017-12-02 NOTE — Telephone Encounter (Signed)
I am happy to see the patient sooner for her annual exam.  I can then order her bone density.  Or we can just wait to order it after I see her.

## 2017-12-03 ENCOUNTER — Ambulatory Visit (INDEPENDENT_AMBULATORY_CARE_PROVIDER_SITE_OTHER): Payer: Medicare Other | Admitting: Obstetrics and Gynecology

## 2017-12-03 ENCOUNTER — Other Ambulatory Visit: Payer: Self-pay

## 2017-12-03 ENCOUNTER — Other Ambulatory Visit (HOSPITAL_COMMUNITY)
Admission: RE | Admit: 2017-12-03 | Discharge: 2017-12-03 | Disposition: A | Discharge status: Home / Self Care | Payer: Medicare Other | Source: Ambulatory Visit | Attending: Obstetrics and Gynecology | Admitting: Obstetrics and Gynecology

## 2017-12-03 ENCOUNTER — Encounter: Payer: Self-pay | Admitting: Obstetrics and Gynecology

## 2017-12-03 VITALS — BP 130/72 | HR 72 | Resp 16 | Ht 65.75 in | Wt 185.0 lb

## 2017-12-03 DIAGNOSIS — Z01419 Encounter for gynecological examination (general) (routine) without abnormal findings: Secondary | ICD-10-CM | POA: Insufficient documentation

## 2017-12-03 DIAGNOSIS — Z124 Encounter for screening for malignant neoplasm of cervix: Secondary | ICD-10-CM

## 2017-12-03 NOTE — Progress Notes (Signed)
72 y.o. G2P2 Widowed Caucasian female here for annual exam.    Just had first grand child.  Still working as a Engineer, maintenance (IT).   Dr. Collene Mares following her liver function studies.  This will be rechecked again in a couple of months.   Steady partner.   PCP: Dr. Kathyrn Lass    Patient's last menstrual period was 06/30/1996.           Sexually active: No.  The current method of family planning is post menopausal status.    Exercising: Yes.    weights and walking Smoker:  no  Health Maintenance: Pap:  09/08/13 Pap smear negative History of abnormal Pap:  Yes, years ago per patient MMG:  05/21/15 Left [place] BIRADS2:Benign.  Has an appointment tomorrow.  Colonoscopy:  05/18/17 Polyp removed - f/u 5 years BMD:   2009  Result  Osteopenia TDaP:  2013 Gardasil:   n/a HIV: never Hep C: 12/05/15 Negative Screening Labs: PCP   reports that she has never smoked. She has never used smokeless tobacco. She reports that she drinks about 0.6 oz of alcohol per week. She reports that she does not use drugs.  Past Medical History:  Diagnosis Date  . Atrophic vaginitis     Past Surgical History:  Procedure Laterality Date  . ANKLE ARTHROSCOPY Right 2008 ?  Marland Kitchen CATARACT EXTRACTION Right 02/14/11  . CESAREAN SECTION  09/25/1980  . COLONOSCOPY W/ POLYPECTOMY  12/2008   high grade gladular dysplasia recheck in 3 years  . COLONOSCOPY W/ POLYPECTOMY  04/12/12   polyp X 4 tubular adenoma - recheck in 5 years.    Current Outpatient Medications  Medication Sig Dispense Refill  . Multiple Vitamin (MULTI-VITAMIN DAILY PO) Take by mouth daily.    . Multiple Vitamins-Minerals (PRESERVISION AREDS 2 PO) Take by mouth.    Marland Kitchen omeprazole (PRILOSEC) 20 MG capsule TAKE 1 CAPSULE EVERY MORNING 30 MINUTES PRIOR TO BREAKFAST  1   No current facility-administered medications for this visit.     Family History  Problem Relation Age of Onset  . Breast cancer Mother   . Cancer Mother        Kidney Cancer  . Cancer Father         Lung Cancer  . Osteoporosis Sister   . Breast cancer Sister     Review of Systems  Constitutional: Negative.   HENT: Negative.   Eyes: Negative.   Respiratory: Negative.   Cardiovascular: Negative.   Gastrointestinal: Negative.   Endocrine: Negative.   Genitourinary: Negative.   Musculoskeletal: Negative.   Skin: Negative.   Allergic/Immunologic: Negative.   Neurological: Negative.   Hematological: Negative.   Psychiatric/Behavioral: Negative.     Exam:   BP 130/72 (BP Location: Right Arm, Patient Position: Sitting, Cuff Size: Normal)   Pulse 72   Resp 16   Ht 5' 5.75" (1.67 m)   Wt 185 lb (83.9 kg)   LMP 06/30/1996   BMI 30.09 kg/m     General appearance: alert, cooperative and appears stated age Head: Normocephalic, without obvious abnormality, atraumatic Neck: no adenopathy, supple, symmetrical, trachea midline and thyroid normal to inspection and palpation Lungs: clear to auscultation bilaterally Breasts: normal appearance, no masses or tenderness, No nipple retraction or dimpling, No nipple discharge or bleeding, No axillary or supraclavicular adenopathy Heart: regular rate and rhythm Abdomen: soft, non-tender; no masses, no organomegaly Extremities: extremities normal, atraumatic, no cyanosis or edema Skin: Skin color, texture, turgor normal. No rashes or lesions Lymph nodes: Cervical,  supraclavicular, and axillary nodes normal. No abnormal inguinal nodes palpated Neurologic: Grossly normal  Pelvic: External genitalia:  no lesions              Urethra:  normal appearing urethra with no masses, tenderness or lesions              Bartholins and Skenes: normal                 Vagina: normal appearing vagina with normal color and discharge, no lesions              Cervix: no lesions              Pap taken: Yes.   Bimanual Exam:  Uterus:  normal size, contour, position, consistency, mobility, non-tender              Adnexa: no mass, fullness, tenderness               Rectal exam: Yes.  .  Confirms.              Anus:  normal sphincter tone, no lesions  Chaperone was present for exam.  Assessment:   Well woman visit with normal exam. Osteopenia.  Remote hx of HPV.  Status post cryotherapy.  FH breast cancer.  Postmenopausal. Elevated LFTs.  Plan: Mammogram screening.  3D recommended.  Recommended self breast awareness. Pap and HR HPV as above. Guidelines for Calcium, Vitamin D, regular exercise program including cardiovascular and weight bearing exercise. Labs with PCP and Dr. Collene Mares. BMD ordered.  Follow up annually and prn.   After visit summary provided.

## 2017-12-03 NOTE — Patient Instructions (Signed)

## 2017-12-04 DIAGNOSIS — M8589 Other specified disorders of bone density and structure, multiple sites: Secondary | ICD-10-CM | POA: Diagnosis not present

## 2017-12-04 DIAGNOSIS — Z1231 Encounter for screening mammogram for malignant neoplasm of breast: Secondary | ICD-10-CM | POA: Diagnosis not present

## 2017-12-04 DIAGNOSIS — Z780 Asymptomatic menopausal state: Secondary | ICD-10-CM | POA: Diagnosis not present

## 2017-12-04 DIAGNOSIS — Z803 Family history of malignant neoplasm of breast: Secondary | ICD-10-CM | POA: Diagnosis not present

## 2017-12-07 LAB — CYTOLOGY - PAP: Diagnosis: NEGATIVE

## 2017-12-09 ENCOUNTER — Ambulatory Visit: Payer: Medicare Other | Admitting: Obstetrics and Gynecology

## 2017-12-11 DIAGNOSIS — R921 Mammographic calcification found on diagnostic imaging of breast: Secondary | ICD-10-CM | POA: Diagnosis not present

## 2017-12-15 ENCOUNTER — Telehealth: Payer: Self-pay | Admitting: Obstetrics and Gynecology

## 2017-12-15 NOTE — Telephone Encounter (Signed)
Left message to call Posey Jasmin at [phone].  

## 2017-12-15 NOTE — Telephone Encounter (Signed)
Please report BMD results showing osteopenia of the hips and spine.  This is bone thinning but not osteoporosis.   The risk of fracture is not considered high enough to proceed with pharmacologic treatment.   1200 mg of calcium daily, 800 IU of vitamin D daily, and weight bearing exercise will help to maintain bone density and strength.  Her next bone density is due in 2 years.

## 2017-12-16 NOTE — Telephone Encounter (Signed)
Patient notified of BMD results per Dr.Silva's message below.

## 2017-12-16 NOTE — Telephone Encounter (Signed)
Patient returned call

## 2018-01-20 ENCOUNTER — Encounter: Payer: Self-pay | Admitting: Obstetrics and Gynecology

## 2018-02-03 DIAGNOSIS — M25562 Pain in left knee: Secondary | ICD-10-CM | POA: Diagnosis not present

## 2018-03-30 IMAGING — US US ABDOMEN LIMITED
1 series · 14 of 25 positions shown · non-contrast
Comparison: None.

CLINICAL DATA: Elevated liver function tests.

EXAM:
ULTRASOUND ABDOMEN LIMITED RIGHT UPPER QUADRANT

[Series 1: us abdomen limited · 0.19mm/px · 14 of 48 slices shown]
[im 1/48]
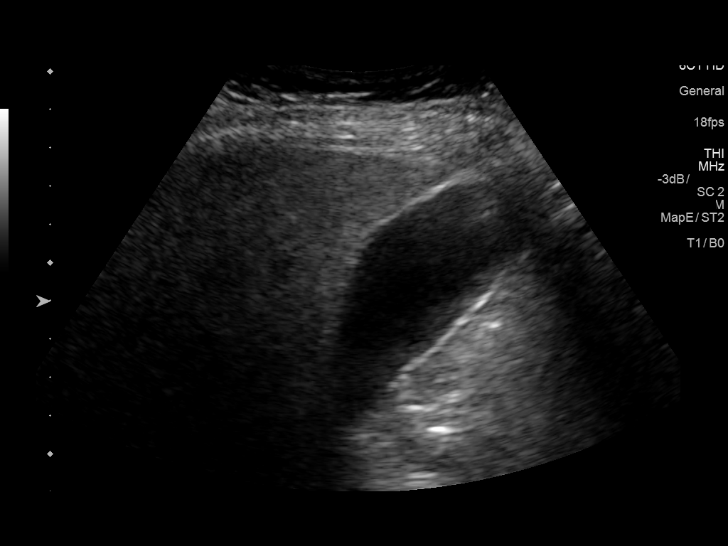
[im 4/48]
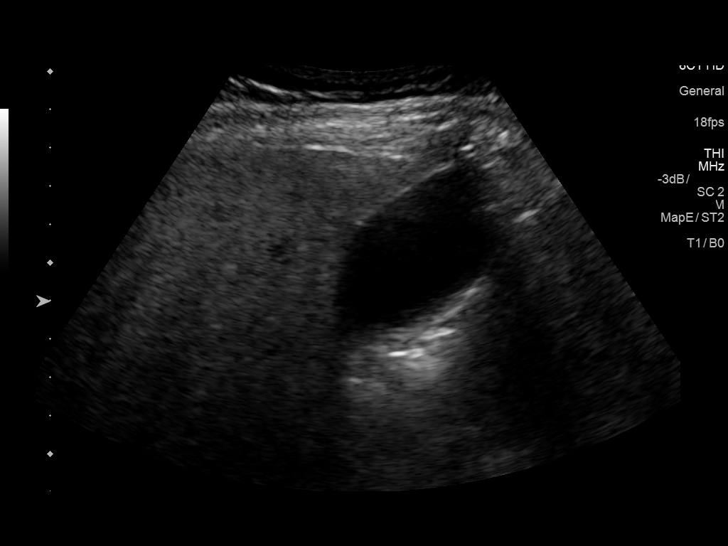
[im 8/48]
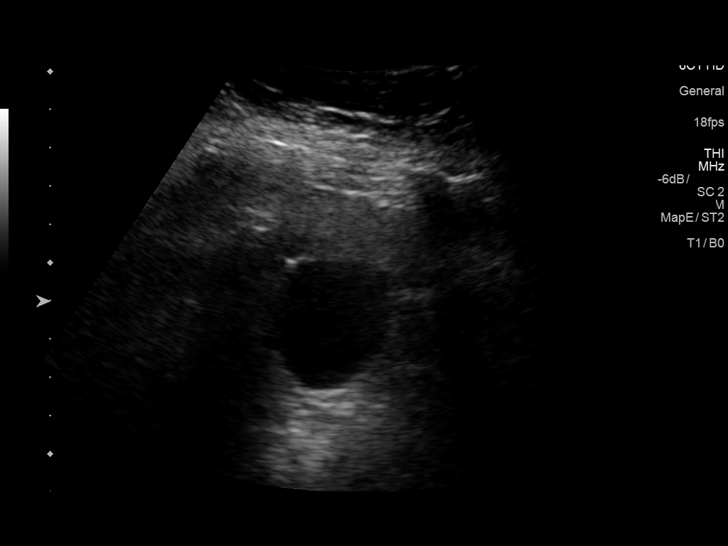
[im 12/48]
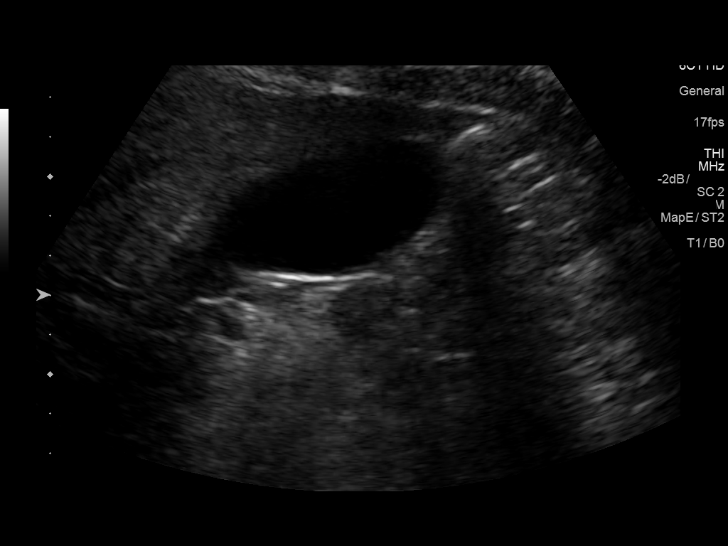
[im 16/48]
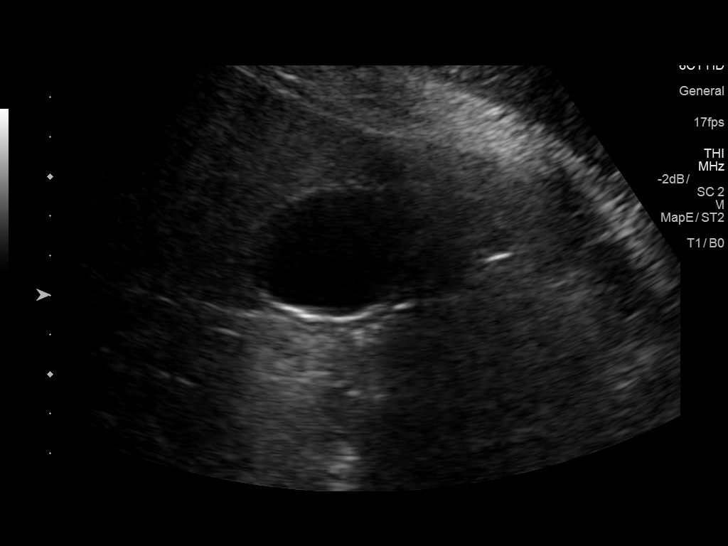
[im 18/48]
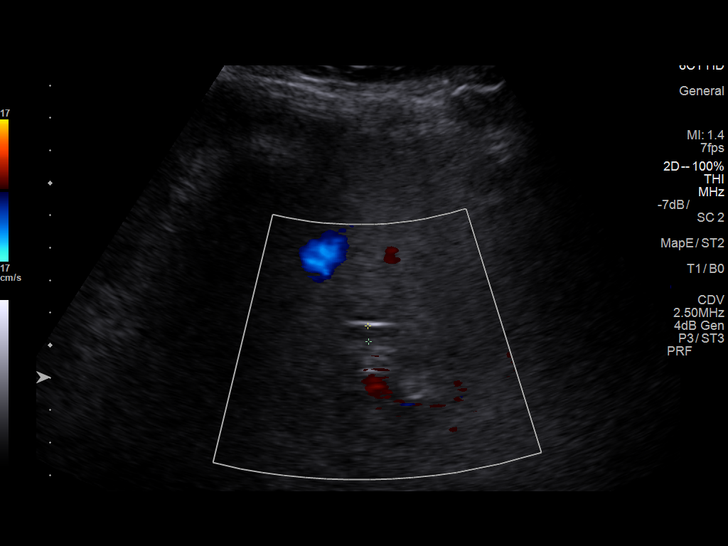
[im 22/48]
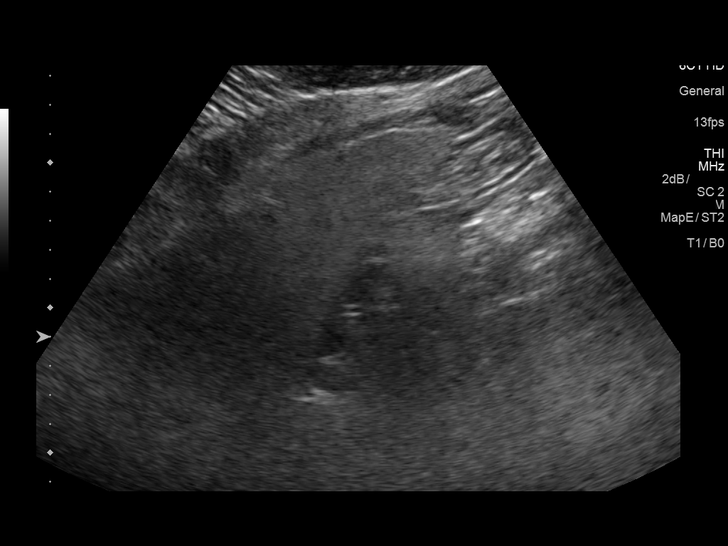
[im 26/48]
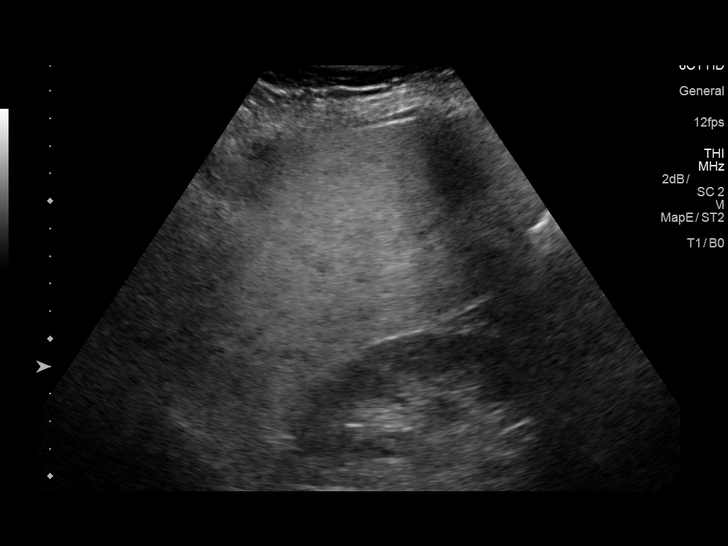
[im 30/48]
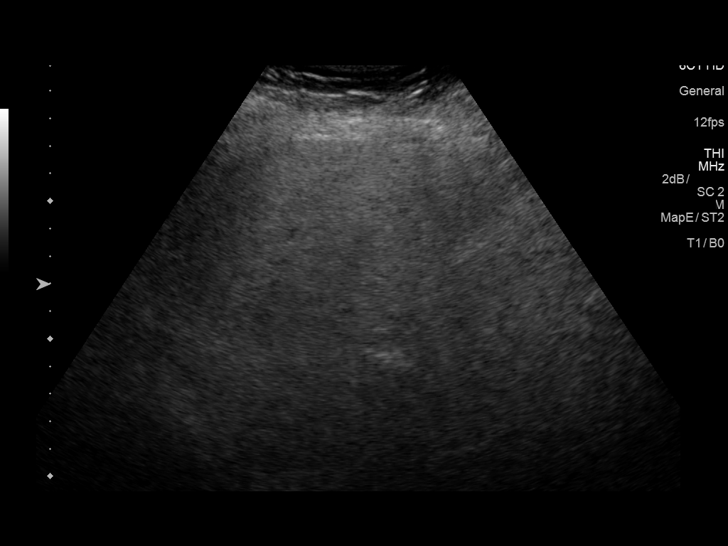
[im 32/48]
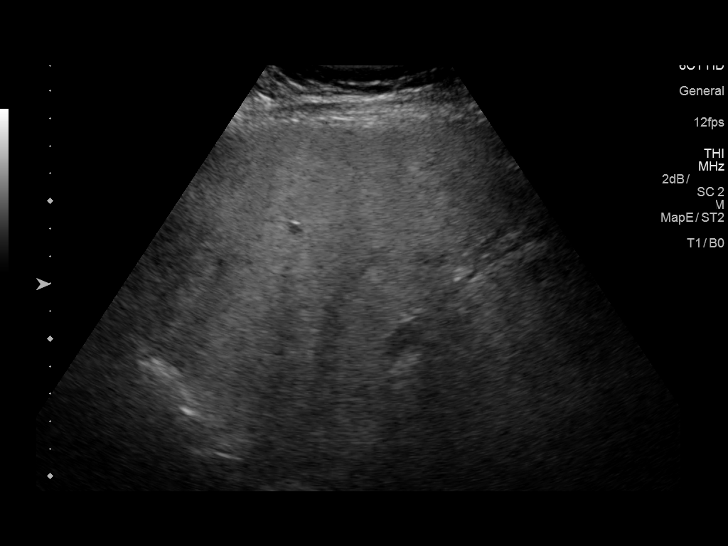
[im 36/48]
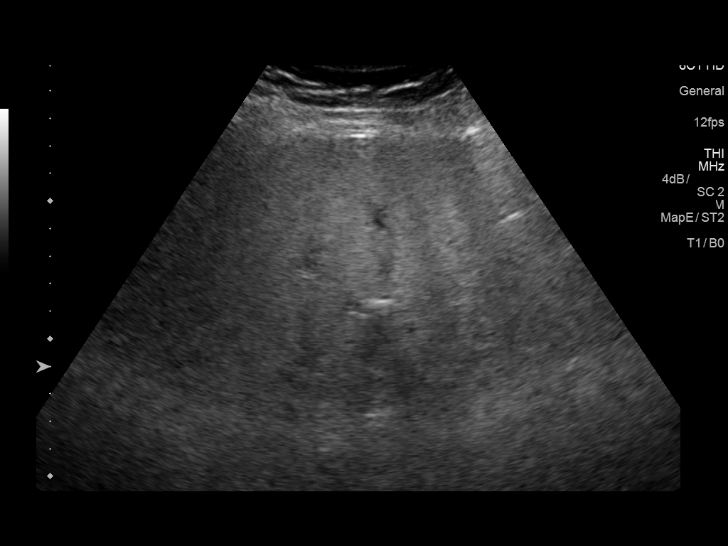
[im 40/48]
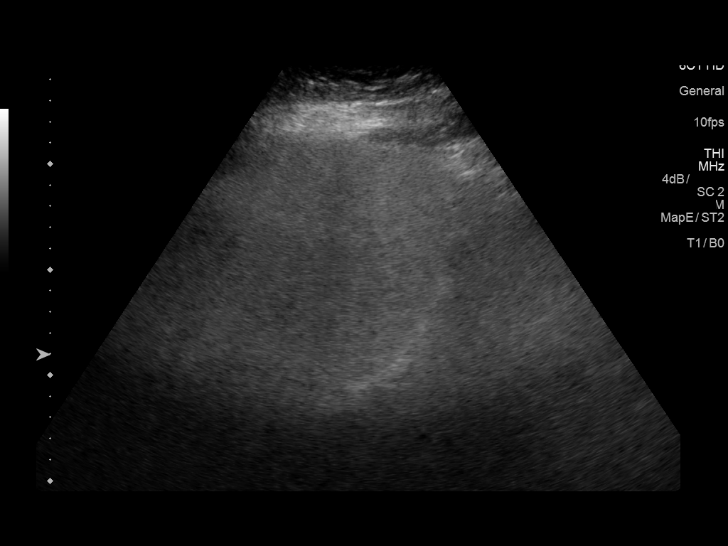
[im 44/48]
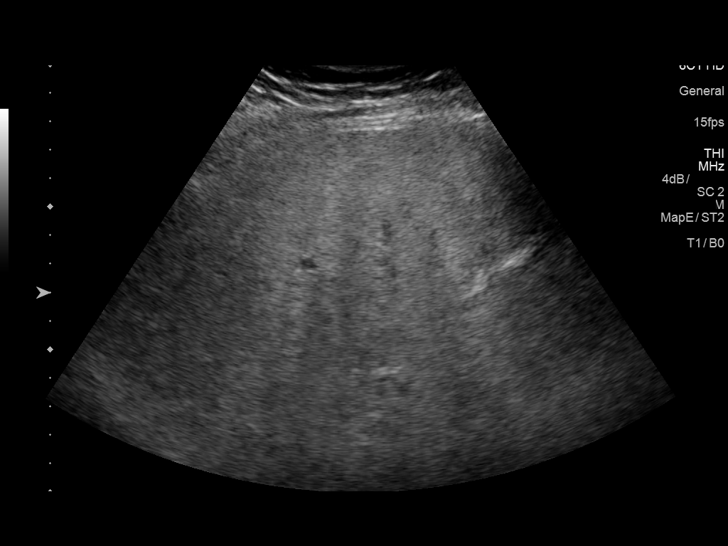
[im 48/48]
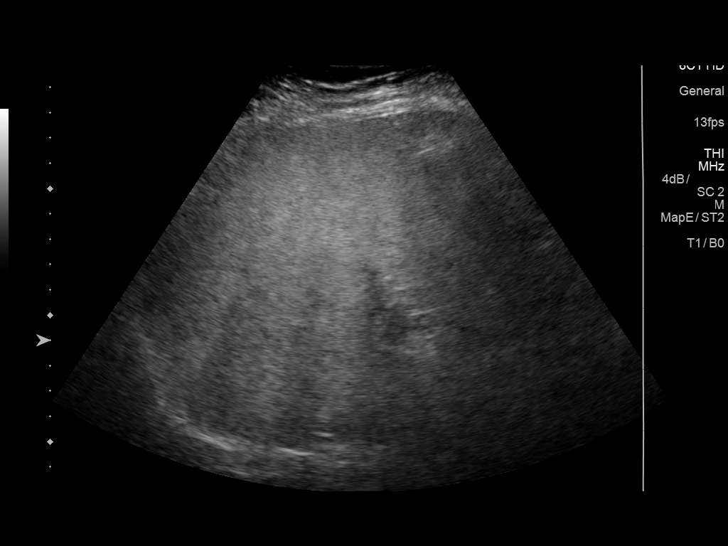

[14 of 25 positions shown; findings below may reference images not displayed]

FINDINGS: Gallbladder:

No gallstones or wall thickening visualized. No sonographic Murphy
sign noted by sonographer.

Common bile duct:

Diameter: 4.8 mm which is within normal limits.

Liver:

No focal lesion identified. Increased echogenicity of hepatic
parenchyma is noted suggesting fatty infiltration or other diffuse
hepatocellular disease. Portal vein is patent on color Doppler
imaging with normal direction of blood flow towards the liver.
IMPRESSION: Increased echogenicity of hepatic parenchyma is noted suggesting
fatty infiltration or other diffuse hepatocellular disease. No other
abnormality seen in the right upper quadrant of the abdomen.

## 2018-06-15 ENCOUNTER — Encounter: Payer: Self-pay | Admitting: Obstetrics and Gynecology

## 2018-06-15 DIAGNOSIS — Z803 Family history of malignant neoplasm of breast: Secondary | ICD-10-CM | POA: Diagnosis not present

## 2018-06-15 DIAGNOSIS — R921 Mammographic calcification found on diagnostic imaging of breast: Secondary | ICD-10-CM | POA: Diagnosis not present

## 2018-06-30 HISTORY — PX: BREAST BIOPSY: SHX20

## 2018-07-05 ENCOUNTER — Encounter: Payer: Self-pay | Admitting: Obstetrics and Gynecology

## 2018-07-05 ENCOUNTER — Other Ambulatory Visit: Payer: Self-pay | Admitting: Radiology

## 2018-07-05 DIAGNOSIS — N6012 Diffuse cystic mastopathy of left breast: Secondary | ICD-10-CM | POA: Diagnosis not present

## 2018-07-05 DIAGNOSIS — R921 Mammographic calcification found on diagnostic imaging of breast: Secondary | ICD-10-CM | POA: Diagnosis not present

## 2018-07-05 DIAGNOSIS — R590 Localized enlarged lymph nodes: Secondary | ICD-10-CM | POA: Diagnosis not present

## 2018-07-22 DIAGNOSIS — L821 Other seborrheic keratosis: Secondary | ICD-10-CM | POA: Diagnosis not present

## 2018-07-22 DIAGNOSIS — L570 Actinic keratosis: Secondary | ICD-10-CM | POA: Diagnosis not present

## 2018-07-22 DIAGNOSIS — D2261 Melanocytic nevi of right upper limb, including shoulder: Secondary | ICD-10-CM | POA: Diagnosis not present

## 2018-07-22 DIAGNOSIS — Z23 Encounter for immunization: Secondary | ICD-10-CM | POA: Diagnosis not present

## 2018-07-22 DIAGNOSIS — Z85828 Personal history of other malignant neoplasm of skin: Secondary | ICD-10-CM | POA: Diagnosis not present

## 2018-07-22 DIAGNOSIS — D2271 Melanocytic nevi of right lower limb, including hip: Secondary | ICD-10-CM | POA: Diagnosis not present

## 2018-12-17 ENCOUNTER — Encounter: Payer: Self-pay | Admitting: Obstetrics and Gynecology

## 2018-12-17 DIAGNOSIS — R921 Mammographic calcification found on diagnostic imaging of breast: Secondary | ICD-10-CM | POA: Diagnosis not present

## 2018-12-28 ENCOUNTER — Other Ambulatory Visit: Payer: Self-pay

## 2018-12-29 ENCOUNTER — Ambulatory Visit (INDEPENDENT_AMBULATORY_CARE_PROVIDER_SITE_OTHER): Payer: Medicare Other | Admitting: Obstetrics and Gynecology

## 2018-12-29 ENCOUNTER — Encounter: Payer: Self-pay | Admitting: Obstetrics and Gynecology

## 2018-12-29 ENCOUNTER — Encounter

## 2018-12-29 VITALS — BP 132/74 | HR 76 | Temp 97.7°F | Resp 12 | Ht 66.25 in | Wt 184.0 lb

## 2018-12-29 DIAGNOSIS — Z01419 Encounter for gynecological examination (general) (routine) without abnormal findings: Secondary | ICD-10-CM

## 2018-12-29 DIAGNOSIS — B029 Zoster without complications: Secondary | ICD-10-CM | POA: Diagnosis not present

## 2018-12-29 DIAGNOSIS — Z124 Encounter for screening for malignant neoplasm of cervix: Secondary | ICD-10-CM

## 2018-12-29 MED ORDER — VALACYCLOVIR HCL 1 G PO TABS: 1000.0000 mg | ORAL_TABLET | Freq: Three times a day (TID) | ORAL | 0 refills | Status: DC

## 2018-12-29 NOTE — Patient Instructions (Addendum)
EXERCISE AND DIET:  We recommended that you start or continue a regular exercise program for good health. Regular exercise means any activity that makes your heart beat faster and makes you sweat.  We recommend exercising at least 30 minutes per day at least 3 days a week, preferably 4 or 5.  We also recommend a diet low in fat and sugar.  Inactivity, poor dietary choices and obesity can cause diabetes, heart attack, stroke, and kidney damage, among others.    ALCOHOL AND SMOKING:  Women should limit their alcohol intake to no more than 7 drinks/beers/glasses of wine (combined, not each!) per week. Moderation of alcohol intake to this level decreases your risk of breast cancer and liver damage. And of course, no recreational drugs are part of a healthy lifestyle.  And absolutely no smoking or even second hand smoke. Most people know smoking can cause heart and lung diseases, but did you know it also contributes to weakening of your bones? Aging of your skin?  Yellowing of your teeth and nails?  CALCIUM AND VITAMIN D:  Adequate intake of calcium and Vitamin D are recommended.  The recommendations for exact amounts of these supplements seem to change often, but generally speaking 600 mg of calcium (either carbonate or citrate) and 800 units of Vitamin D per day seems prudent. Certain women may benefit from higher intake of Vitamin D.  If you are among these women, your doctor will have told you during your visit.    PAP SMEARS:  Pap smears, to check for cervical cancer or precancers,  have traditionally been done yearly, although recent scientific advances have shown that most women can have pap smears less often.  However, every woman still should have a physical exam from her gynecologist every year. It will include a breast check, inspection of the vulva and vagina to check for abnormal growths or skin changes, a visual exam of the cervix, and then an exam to evaluate the size and shape of the uterus and  ovaries.  And after 72 years of age, a rectal exam is indicated to check for rectal cancers. We will also provide age appropriate advice regarding health maintenance, like when you should have certain vaccines, screening for sexually transmitted diseases, bone density testing, colonoscopy, mammograms, etc.   MAMMOGRAMS:  All women over 40 years old should have a yearly mammogram. Many facilities now offer a "3D" mammogram, which may cost around $50 extra out of pocket. If possible,  we recommend you accept the option to have the 3D mammogram performed.  It both reduces the number of women who will be called back for extra views which then turn out to be normal, and it is better than the routine mammogram at detecting truly abnormal areas.    COLONOSCOPY:  Colonoscopy to screen for colon cancer is recommended for all women at age 50.  We know, you hate the idea of the prep.  We agree, BUT, having colon cancer and not knowing it is worse!!  Colon cancer so often starts as a polyp that can be seen and removed at colonscopy, which can quite literally save your life!  And if your first colonoscopy is normal and you have no family history of colon cancer, most women don't have to have it again for 10 years.  Once every ten years, you can do something that may end up saving your life, right?  We will be happy to help you get it scheduled when you are ready.    Be sure to check your insurance coverage so you understand how much it will cost.  It may be covered as a preventative service at no cost, but you should check your particular policy.      Shingles  Shingles, which is also known as herpes zoster, is an infection that causes a painful skin rash and fluid-filled blisters. It is caused by a virus. Shingles only develops in people who:  Have had chickenpox.  Have been given a medicine to protect against chickenpox (have been vaccinated). Shingles is rare in this group. What are the causes? Shingles is  caused by varicella-zoster virus (VZV). This is the same virus that causes chickenpox. After a person is exposed to VZV, the virus stays in the body in an inactive (dormant) state. Shingles develops if the virus is reactivated. This can happen many years after the first (initial) exposure to VZV. It is not known what causes this virus to be reactivated. What increases the risk? People who have had chickenpox or received the chickenpox vaccine are at risk for shingles. Shingles infection is more common in people who:  Are older than age 72.  Have a weakened disease-fighting system (immune system), such as people with: ? HIV. ? AIDS. ? Cancer.  Are taking medicines that weaken the immune system, such as transplant medicines.  Are experiencing a lot of stress. What are the signs or symptoms? Early symptoms of this condition include itching, tingling, and pain in an area on your skin. Pain may be described as burning, stabbing, or throbbing. A few days or weeks after early symptoms start, a painful red rash appears. The rash is usually on one side of the body and has a band-like or belt-like pattern. The rash eventually turns into fluid-filled blisters that break open, change into scabs, and dry up in about 2-3 weeks. At any time during the infection, you may also develop:  A fever.  Chills.  A headache.  An upset stomach. How is this diagnosed? This condition is diagnosed with a skin exam. Skin or fluid samples may be taken from the blisters before a diagnosis is made. These samples are examined under a microscope or sent to a lab for testing. How is this treated? The rash may last for several weeks. There is not a specific cure for this condition. Your health care provider will probably prescribe medicines to help you manage pain, recover more quickly, and avoid long-term problems. Medicines may include:  Antiviral drugs.  Anti-inflammatory drugs.  Pain medicines.  Anti-itching  medicines (antihistamines). If the area involved is on your face, you may be referred to a specialist, such as an eye doctor (ophthalmologist) or an ear, nose, and throat (ENT) doctor (otolaryngologist) to help you avoid eye problems, chronic pain, or disability. Follow these instructions at home: Medicines  Take over-the-counter and prescription medicines only as told by your health care provider.  Apply an anti-itch cream or numbing cream to the affected area as told by your health care provider. Relieving itching and discomfort   Apply cold, wet cloths (cold compresses) to the area of the rash or blisters as told by your health care provider.  Cool baths can be soothing. Try adding baking soda or dry oatmeal to the water to reduce itching. Do not bathe in hot water. Blister and rash care  Keep your rash covered with a loose bandage (dressing). Wear loose-fitting clothing to help ease the pain of material rubbing against the rash.  Keep your rash  and blisters clean by washing the area with mild soap and cool water as told by your health care provider.  Check your rash every day for signs of infection. Check for: ? More redness, swelling, or pain. ? Fluid or blood. ? Warmth. ? Pus or a bad smell.  Do not scratch your rash or pick at your blisters. To help avoid scratching: ? Keep your fingernails clean and cut short. ? Wear gloves or mittens while you sleep, if scratching is a problem. General instructions  Rest as told by your health care provider.  Keep all follow-up visits as told by your health care provider. This is important.  Wash your hands often with soap and water. If soap and water are not available, use hand sanitizer. Doing this lowers your chance of getting a bacterial skin infection.  Before your blisters change into scabs, your shingles infection can cause chickenpox in people who have never had it or have never been vaccinated against it. To prevent this from  happening, avoid contact with other people, especially: ? Babies. ? Pregnant women. ? Children who have eczema. ? Elderly people who have transplants. ? People who have chronic illnesses, such as cancer or AIDS. Contact a health care provider if:  Your pain is not relieved with prescribed medicines.  Your pain does not get better after the rash heals.  You have signs of infection in the rash area, such as: ? More redness, swelling, or pain around the rash. ? Fluid or blood coming from the rash. ? The rash area feeling warm to the touch. ? Pus or a bad smell coming from the rash. Get help right away if:  The rash is on your face or nose.  You have facial pain, pain around your eye area, or loss of feeling on one side of your face.  You have difficulty seeing.  You have ear pain or have ringing in your ear.  You have a loss of taste.  Your condition gets worse. Summary  Shingles, which is also known as herpes zoster, is an infection that causes a painful skin rash and fluid-filled blisters.  This condition is diagnosed with a skin exam. Skin or fluid samples may be taken from the blisters and examined before the diagnosis is made.  Keep your rash covered with a loose bandage (dressing). Wear loose-fitting clothing to help ease the pain of material rubbing against the rash.  Before your blisters change into scabs, your shingles infection can cause chickenpox in people who have never had it or have never been vaccinated against it. This information is not intended to replace advice given to you by your health care provider. Make sure you discuss any questions you have with your health care provider. Document Released: 06/16/2005 Document Revised: 10/08/2018 Document Reviewed: 02/18/2017 Elsevier Patient Education  2020 [employer].

## 2018-12-29 NOTE — Progress Notes (Signed)
73 y.o. G2P2 Widowed Caucasian female here for annual exam.    She has a rash under her right breast for 5 days.  Wonders if it is shingles.  It is not painful but it is uncomfortable.  She did receive the old shingles vaccine, but not Shingrix.   No vaginal bleeding.  No bladder or bowel function problems.   2 new grand daughters.   Continuing to work during the pandemic.  She is a Engineer, maintenance (IT).   PCP: Kathyrn Lass, MD     Patient's last menstrual period was 06/30/1996.           Sexually active: No.  The current method of family planning is post menopausal status.    Exercising: Yes.    walking and weights Smoker:  no  Health Maintenance: Pap: 12/03/17 Negative History of abnormal Pap:  Yes, years ago per patient MMG:  12/17/18 at [facility] -- BI-RADS 2. Colonoscopy:  05/18/17 Polyp removed - f/u 5 years BMD:   12/04/17  Result  Osteopenia TDaP:  07/23/11 HIV: never Hep C: 12/05/15 Negative Screening Labs:  PCP   reports that she has never smoked. She has never used smokeless tobacco. She reports current alcohol use of about 1.0 standard drinks of alcohol per week. She reports that she does not use drugs.  Past Medical History:  Diagnosis Date  . Atrophic vaginitis     Past Surgical History:  Procedure Laterality Date  . ANKLE ARTHROSCOPY Right 2008 ?  Marland Kitchen BREAST BIOPSY Left 06/2018   fibrocystic change and adenosis.   Marland Kitchen CATARACT EXTRACTION Right 02/14/11  . CESAREAN SECTION  09/25/1980  . COLONOSCOPY W/ POLYPECTOMY  12/2008   high grade gladular dysplasia recheck in 3 years  . COLONOSCOPY W/ POLYPECTOMY  04/12/12   polyp X 4 tubular adenoma - recheck in 5 years.    Current Outpatient Medications  Medication Sig Dispense Refill  . Multiple Vitamin (MULTI-VITAMIN DAILY PO) Take by mouth daily.    . Multiple Vitamins-Minerals (PRESERVISION AREDS 2 PO) Take by mouth.    Marland Kitchen omeprazole (PRILOSEC) 20 MG capsule TAKE 1 CAPSULE EVERY MORNING 30 MINUTES PRIOR TO BREAKFAST  1   No current  facility-administered medications for this visit.     Family History  Problem Relation Age of Onset  . Breast cancer Mother   . Cancer Mother        Kidney Cancer  . Cancer Father        Lung Cancer  . Osteoporosis Sister   . Breast cancer Sister     Review of Systems  Constitutional: Negative.   HENT: Negative.   Eyes: Negative.   Respiratory: Negative.   Cardiovascular: Negative.   Gastrointestinal: Negative.   Endocrine: Negative.   Genitourinary: Negative.   Musculoskeletal: Negative.   Skin: Negative.   Allergic/Immunologic: Negative.   Neurological: Negative.   Hematological: Negative.   Psychiatric/Behavioral: Negative.     Exam:   BP 132/74 (BP Location: Right Arm, Patient Position: Sitting, Cuff Size: Normal)   Pulse 76   Temp 97.7 F (36.5 C) (Temporal)   Resp 12   Ht 5' 6.25" (1.683 m)   Wt 184 lb (83.5 kg)   LMP 06/30/1996   BMI 29.47 kg/m     General appearance: alert, cooperative and appears stated age Head: normocephalic, without obvious abnormality, atraumatic Neck: no adenopathy, supple, symmetrical, trachea midline and thyroid normal to inspection and palpation Lungs: clear to auscultation bilaterally Breasts: normal appearance, no masses or tenderness, No  nipple retraction or dimpling, No nipple discharge or bleeding, No axillary adenopathy Heart: regular rate and rhythm Abdomen: soft, non-tender; no masses, no organomegaly Extremities: extremities normal, atraumatic, no cyanosis or edema Skin: vesicular lesions along dermatome of the right chest wall.  Lymph nodes: cervical, supraclavicular, and axillary nodes normal. Neurologic: grossly normal  Pelvic: External genitalia:  no lesions              No abnormal inguinal nodes palpated.              Urethra:  normal appearing urethra with no masses, tenderness or lesions              Bartholins and Skenes: normal                 Vagina: normal appearing vagina with normal color and discharge,  no lesions              Cervix: no lesions              Pap taken: No. Bimanual Exam:  Uterus:  normal size, contour, position, consistency, mobility, non-tender              Adnexa: no mass, fullness, tenderness              Rectal exam: Yes.  .  Confirms.              Anus:  normal sphincter tone, no lesions  Chaperone was present for exam.  Assessment:   Well woman visit with normal exam. Osteopenia.  Remote hx of HPV.  Status post cryotherapy.  FH breast cancer.  Postmenopausal. Status post left breast biopsy - fibrocystic change with adenosis.  Herpes zoster.   Plan: Mammogram screening discussed. Self breast awareness reviewed. Pap and HR HPV as above. Guidelines for Calcium, Vitamin D, regular exercise program including cardiovascular and weight bearing exercise. BMD next year.  Follow up annually and prn.   Additional counseling given.  Yes.  . ___15____ minutes face to face time of which over 50% was spent in counseling.  We discussed herpes zoster - cause, symptoms, complications, treatment, and risk of infection to others.   Rx for Valtrex 1000 mg po q 8 hours prn.   She wishes to take this even though her symptoms presented more than 72 hours ago.  She will contact her PCP to update her also.    After visit summary provided.

## 2019-08-04 DIAGNOSIS — C44311 Basal cell carcinoma of skin of nose: Secondary | ICD-10-CM | POA: Diagnosis not present

## 2019-08-04 DIAGNOSIS — L821 Other seborrheic keratosis: Secondary | ICD-10-CM | POA: Diagnosis not present

## 2019-08-04 DIAGNOSIS — L570 Actinic keratosis: Secondary | ICD-10-CM | POA: Diagnosis not present

## 2019-08-04 DIAGNOSIS — D2271 Melanocytic nevi of right lower limb, including hip: Secondary | ICD-10-CM | POA: Diagnosis not present

## 2019-08-04 DIAGNOSIS — D2261 Melanocytic nevi of right upper limb, including shoulder: Secondary | ICD-10-CM | POA: Diagnosis not present

## 2019-08-04 DIAGNOSIS — Z85828 Personal history of other malignant neoplasm of skin: Secondary | ICD-10-CM | POA: Diagnosis not present

## 2019-08-04 DIAGNOSIS — D485 Neoplasm of uncertain behavior of skin: Secondary | ICD-10-CM | POA: Diagnosis not present

## 2019-08-04 DIAGNOSIS — L578 Other skin changes due to chronic exposure to nonionizing radiation: Secondary | ICD-10-CM | POA: Diagnosis not present

## 2019-08-04 DIAGNOSIS — Z23 Encounter for immunization: Secondary | ICD-10-CM | POA: Diagnosis not present

## 2019-09-29 DIAGNOSIS — C44311 Basal cell carcinoma of skin of nose: Secondary | ICD-10-CM | POA: Diagnosis not present

## 2019-10-07 ENCOUNTER — Telehealth: Payer: Self-pay | Admitting: General Practice

## 2019-10-07 NOTE — Telephone Encounter (Signed)
   Checking to why pt received a call. Advise no notation on chart. Pt is not a pt of [facility]

## 2019-11-02 DIAGNOSIS — L570 Actinic keratosis: Secondary | ICD-10-CM | POA: Diagnosis not present

## 2019-11-02 DIAGNOSIS — L719 Rosacea, unspecified: Secondary | ICD-10-CM | POA: Diagnosis not present

## 2019-11-02 DIAGNOSIS — L820 Inflamed seborrheic keratosis: Secondary | ICD-10-CM | POA: Diagnosis not present

## 2019-12-27 ENCOUNTER — Encounter: Payer: Self-pay | Admitting: Obstetrics and Gynecology

## 2020-01-05 NOTE — Progress Notes (Signed)
74 y.o. G2P2 Widowed Caucasian female here for annual exam.    Tired and lack of energy.  Less physical activity.  Working 3 days a week.   Has cold sores and uses Valtrex for that.   Just back from vacation.   Had her Covid vaccine in March, 2021.   PCP: Kathyrn Lass, MD    Patient's last menstrual period was 06/30/1996.            Sexually active: No.  The current method of family planning is post menopausal status.    Exercising: Yes.    walking and personal trainer 2x/week Smoker:  no  Health Maintenance: Pap:  12/03/17 Neg, 09-08-13 Neg History of abnormal Pap:yes, Years ago had cryotherapy of cervix MMG: 12-19-19 3D/Neg/density B/BiRads1 Colonoscopy: 05-18-17 polyp;next 5 years BMD:  12-04-17  Result:  Osteopenia TDaP: 07-23-11 Gardasil:   no [id number] Hep C:12-05-15 Neg Screening Labs:  PCP.   reports that she has never smoked. She has never used smokeless tobacco. She reports current alcohol use of about 1.0 standard drink of alcohol per week. She reports that she does not use drugs.  Past Medical History:  Diagnosis Date  . Abnormal Pap smear of cervix    Hx of cryotherapy of cervix in her 108s or 30s  . Atrophic vaginitis   . HSV-1 (herpes simplex virus 1) infection     Past Surgical History:  Procedure Laterality Date  . ANKLE ARTHROSCOPY Right 2008 ?  Marland Kitchen BREAST BIOPSY Left 06/2018   fibrocystic change and adenosis.   Marland Kitchen CATARACT EXTRACTION Right 02/14/11  . CESAREAN SECTION  09/25/1980  . COLONOSCOPY W/ POLYPECTOMY  12/2008   high grade gladular dysplasia recheck in 3 years  . COLONOSCOPY W/ POLYPECTOMY  04/12/12   polyp X 4 tubular adenoma - recheck in 5 years.    Current Outpatient Medications  Medication Sig Dispense Refill  . Calcium Carb-Cholecalciferol (CALCIUM 500 + D) 500-200 MG-UNIT TABS Take 1 tablet by mouth daily.    . Cholecalciferol (VITAMIN D-3) 125 MCG (5000 UT) TABS Take 1 capsule by mouth daily.    . Misc Natural Products (LUTEIN 20 PO) Take  1 capsule by mouth daily.    . Multiple Vitamin (MULTI-VITAMIN DAILY PO) Take by mouth daily.    Marland Kitchen omeprazole (PRILOSEC) 20 MG capsule TAKE 1 CAPSULE EVERY MORNING 30 MINUTES PRIOR TO BREAKFAST  1  . valACYclovir (VALTREX) 1000 MG tablet Take 1 tablet (1,000 mg total) by mouth every 8 (eight) hours. Take for 7 days. 21 tablet 0   No current facility-administered medications for this visit.    Family History  Problem Relation Age of Onset  . Breast cancer Mother   . Cancer Mother        Kidney Cancer  . Cancer Father        Lung Cancer  . Osteoporosis Sister   . Breast cancer Sister     Review of Systems  Constitutional: Positive for fatigue.  All other systems reviewed and are negative.   Exam:   BP 124/70 (Cuff Size: Large)   Pulse 64   Resp 12   Ht 5' 6.75" (1.695 m)   Wt 184 lb 9.6 oz (83.7 kg)   LMP 06/30/1996   BMI 29.13 kg/m     General appearance: alert, cooperative and appears stated age Head: normocephalic, without obvious abnormality, atraumatic Neck: no adenopathy, supple, symmetrical, trachea midline and thyroid normal to inspection and palpation Lungs: clear to auscultation bilaterally Breasts: normal  appearance, no masses or tenderness, No nipple retraction or dimpling, No nipple discharge or bleeding, No axillary adenopathy Heart: regular rate and rhythm Abdomen: soft, non-tender; no masses, no organomegaly Extremities: extremities normal, atraumatic, no cyanosis or edema Skin: skin color, texture, turgor normal. No rashes or lesions Lymph nodes: cervical, supraclavicular, and axillary nodes normal. Neurologic: grossly normal  Pelvic: External genitalia:  no lesions              No abnormal inguinal nodes palpated.              Urethra:  normal appearing urethra with no masses, tenderness or lesions              Bartholins and Skenes: normal                 Vagina: normal appearing vagina with normal color and discharge, no lesions              Cervix:  no lesions              Pap taken: Yes.   Bimanual Exam:  Uterus:  normal size, contour, position, consistency, mobility, non-tender              Adnexa: no mass, fullness, tenderness              Rectal exam: Yes.  .  Confirms.              Anus:  normal sphincter tone, no lesions  Chaperone was present for exam.  Assessment:   Well woman visit with normal exam. HSV 1. Osteopenia.  Remote hx of HPV. Status post cryotherapy.  FH breast cancer. Postmenopausal. Status post left breast biopsy - fibrocystic change with adenosis.  Fatigue.   Plan: Mammogram screening discussed. Self breast awareness reviewed. Pap and HR HPV as above. Guidelines for Calcium, Vitamin D, regular exercise program including cardiovascular and weight bearing exercise. Rx for Valtrex 2000 mg po q 12 hours x 24 hours prn.  Will fax order to [facility] for BMD.  She will se her PCP for her fatigue. Follow up annually and prn.    After visit summary provided.

## 2020-01-09 ENCOUNTER — Encounter: Payer: Self-pay | Admitting: Obstetrics and Gynecology

## 2020-01-09 ENCOUNTER — Ambulatory Visit (INDEPENDENT_AMBULATORY_CARE_PROVIDER_SITE_OTHER): Payer: Medicare Other | Admitting: Obstetrics and Gynecology

## 2020-01-09 ENCOUNTER — Other Ambulatory Visit: Payer: Self-pay

## 2020-01-09 ENCOUNTER — Other Ambulatory Visit (HOSPITAL_COMMUNITY)
Admission: RE | Admit: 2020-01-09 | Discharge: 2020-01-09 | Disposition: A | Discharge status: Home / Self Care | Payer: Medicare Other | Source: Ambulatory Visit | Attending: Obstetrics and Gynecology | Admitting: Obstetrics and Gynecology

## 2020-01-09 VITALS — BP 124/70 | HR 64 | Resp 12 | Ht 66.75 in | Wt 184.6 lb

## 2020-01-09 DIAGNOSIS — Z01419 Encounter for gynecological examination (general) (routine) without abnormal findings: Secondary | ICD-10-CM | POA: Diagnosis not present

## 2020-01-09 DIAGNOSIS — Z124 Encounter for screening for malignant neoplasm of cervix: Secondary | ICD-10-CM

## 2020-01-09 MED ORDER — VALACYCLOVIR HCL 1 G PO TABS: ORAL_TABLET | ORAL | 1 refills | Status: DC

## 2020-01-09 NOTE — Patient Instructions (Signed)

## 2020-01-10 LAB — CYTOLOGY - PAP: Diagnosis: NEGATIVE

## 2020-04-30 DIAGNOSIS — Z23 Encounter for immunization: Secondary | ICD-10-CM | POA: Diagnosis not present

## 2020-05-16 DIAGNOSIS — H35363 Drusen (degenerative) of macula, bilateral: Secondary | ICD-10-CM | POA: Diagnosis not present

## 2020-05-16 DIAGNOSIS — H25812 Combined forms of age-related cataract, left eye: Secondary | ICD-10-CM | POA: Diagnosis not present

## 2020-05-16 DIAGNOSIS — H524 Presbyopia: Secondary | ICD-10-CM | POA: Diagnosis not present

## 2020-05-16 DIAGNOSIS — H5213 Myopia, bilateral: Secondary | ICD-10-CM | POA: Diagnosis not present

## 2020-05-16 DIAGNOSIS — Z961 Presence of intraocular lens: Secondary | ICD-10-CM | POA: Diagnosis not present

## 2020-05-16 DIAGNOSIS — H52223 Regular astigmatism, bilateral: Secondary | ICD-10-CM | POA: Diagnosis not present

## 2020-05-17 DIAGNOSIS — Z23 Encounter for immunization: Secondary | ICD-10-CM | POA: Diagnosis not present

## 2020-10-09 DIAGNOSIS — R52 Pain, unspecified: Secondary | ICD-10-CM | POA: Diagnosis not present

## 2020-10-09 DIAGNOSIS — R0981 Nasal congestion: Secondary | ICD-10-CM | POA: Diagnosis not present

## 2020-10-09 DIAGNOSIS — B349 Viral infection, unspecified: Secondary | ICD-10-CM | POA: Diagnosis not present

## 2020-10-09 DIAGNOSIS — R059 Cough, unspecified: Secondary | ICD-10-CM | POA: Diagnosis not present

## 2020-10-09 DIAGNOSIS — Z03818 Encounter for observation for suspected exposure to other biological agents ruled out: Secondary | ICD-10-CM | POA: Diagnosis not present

## 2020-10-10 DIAGNOSIS — L570 Actinic keratosis: Secondary | ICD-10-CM | POA: Diagnosis not present

## 2020-11-13 DIAGNOSIS — H5202 Hypermetropia, left eye: Secondary | ICD-10-CM | POA: Diagnosis not present

## 2020-11-13 DIAGNOSIS — H2510 Age-related nuclear cataract, unspecified eye: Secondary | ICD-10-CM | POA: Diagnosis not present

## 2020-11-13 DIAGNOSIS — H52223 Regular astigmatism, bilateral: Secondary | ICD-10-CM | POA: Diagnosis not present

## 2020-11-13 DIAGNOSIS — H5211 Myopia, right eye: Secondary | ICD-10-CM | POA: Diagnosis not present

## 2020-11-13 DIAGNOSIS — H524 Presbyopia: Secondary | ICD-10-CM | POA: Diagnosis not present

## 2020-12-24 DIAGNOSIS — Z1231 Encounter for screening mammogram for malignant neoplasm of breast: Secondary | ICD-10-CM | POA: Diagnosis not present

## 2020-12-25 DIAGNOSIS — Z961 Presence of intraocular lens: Secondary | ICD-10-CM | POA: Diagnosis not present

## 2020-12-25 DIAGNOSIS — H2512 Age-related nuclear cataract, left eye: Secondary | ICD-10-CM | POA: Diagnosis not present

## 2020-12-25 DIAGNOSIS — H25012 Cortical age-related cataract, left eye: Secondary | ICD-10-CM | POA: Diagnosis not present

## 2020-12-25 DIAGNOSIS — H18413 Arcus senilis, bilateral: Secondary | ICD-10-CM | POA: Diagnosis not present

## 2020-12-25 DIAGNOSIS — H353131 Nonexudative age-related macular degeneration, bilateral, early dry stage: Secondary | ICD-10-CM | POA: Diagnosis not present

## 2020-12-25 DIAGNOSIS — H25042 Posterior subcapsular polar age-related cataract, left eye: Secondary | ICD-10-CM | POA: Diagnosis not present

## 2021-01-08 DIAGNOSIS — M85851 Other specified disorders of bone density and structure, right thigh: Secondary | ICD-10-CM | POA: Diagnosis not present

## 2021-01-08 DIAGNOSIS — M85852 Other specified disorders of bone density and structure, left thigh: Secondary | ICD-10-CM | POA: Diagnosis not present

## 2021-01-08 NOTE — Progress Notes (Signed)
75 y.o. G2P2 Widowed Caucasian female here for breast and pelvic exam.    Still working.  Fatigue.  Not sleeping as well.  When she does sleep well, she does feel better.   Has hx right orthoscopic surgery and left Baker's cyst.   Has 4 grandchildren now.   Receiver first Covid booster.   PCP: Kathyrn Lass, MD    Patient's last menstrual period was 06/30/1996.           Sexually active: No.  The current method of family planning is post menopausal status.    Exercising: Yes.     Works out with trainer 2x/week and walking Smoker:  no  Health Maintenance: Pap: 01-09-20 Neg, 12/03/17 Neg, 09-08-13 Neg History of abnormal Pap:  yes, Hx of cryotherapy to cervix years ago MMG: 12-24-20 BI-RADS2, [facility].  Colonoscopy: 05-18-17 polyp;next 5 years  BMD: 01-08-21 Result :Osteopenia per patient at [facility].  TDaP: 07-23-11 Gardasil:   no [id number] Hep C:12-05-15 Neg Screening Labs:  PCP.    reports that she has never smoked. She has never used smokeless tobacco. She reports current alcohol use of about 1.0 standard drink of alcohol per week. She reports that she does not use drugs.  Past Medical History:  Diagnosis Date   Abnormal Pap smear of cervix    Hx of cryotherapy of cervix in her 42s or 30s   Atrophic vaginitis    HSV-1 (herpes simplex virus 1) infection     Past Surgical History:  Procedure Laterality Date   ANKLE ARTHROSCOPY Right 2008 ?   BREAST BIOPSY Left 06/2018   fibrocystic change and adenosis.    CATARACT EXTRACTION Right 02/14/11   CESAREAN SECTION  09/25/1980   COLONOSCOPY W/ POLYPECTOMY  12/2008   high grade gladular dysplasia recheck in 3 years   COLONOSCOPY W/ POLYPECTOMY  04/12/12   polyp X 4 tubular adenoma - recheck in 5 years.    Current Outpatient Medications  Medication Sig Dispense Refill   Calcium Carb-Cholecalciferol (CALCIUM 500 + D) 500-200 MG-UNIT TABS Take 1 tablet by mouth daily.     Cholecalciferol (VITAMIN D-3) 125 MCG (5000 UT) TABS Take 1  capsule by mouth daily.     Misc Natural Products (LUTEIN 20 PO) Take 1 capsule by mouth daily.     Multiple Vitamin (MULTI-VITAMIN DAILY PO) Take by mouth daily.     omeprazole (PRILOSEC) 20 MG capsule TAKE 1 CAPSULE EVERY MORNING 30 MINUTES PRIOR TO BREAKFAST  1   valACYclovir (VALTREX) 1000 MG tablet Take 2 tablets (2000 mg) by mouth every 12 hours for 24 hours as needed. 30 tablet 1   Difluprednate 0.05 % EMUL Place 1 drop into the left eye 4 (four) times daily. (Patient not taking: Reported on 01/14/2021)     gatifloxacin (ZYMAXID) 0.5 % SOLN Place 1 drop into the left eye 4 (four) times daily. (Patient not taking: Reported on 01/14/2021)     No current facility-administered medications for this visit.    Family History  Problem Relation Age of Onset   Breast cancer Mother    Cancer Mother        Kidney Cancer   Cancer Father        Lung Cancer   Osteoporosis Sister    Breast cancer Sister     Review of Systems  All other systems reviewed and are negative.  Exam:   BP (!) 150/82   Pulse 82   Ht 5' 6.25" (1.683 m)   Wt 181  lb (82.1 kg)   LMP 06/30/1996   SpO2 99%   BMI 28.99 kg/m     General appearance: alert, cooperative and appears stated age Head: normocephalic, without obvious abnormality, atraumatic Neck: no adenopathy, supple, symmetrical, trachea midline and thyroid normal to inspection and palpation Lungs: clear to auscultation bilaterally Breasts: normal appearance, no masses or tenderness, No nipple retraction or dimpling, No nipple discharge or bleeding, No axillary adenopathy Heart: regular rate and rhythm Abdomen: soft, non-tender; no masses, no organomegaly Extremities: extremities normal, atraumatic, no cyanosis or edema Skin: skin color, texture, turgor normal. No rashes or lesions Lymph nodes: cervical, supraclavicular, and axillary nodes normal. Neurologic: grossly normal  Pelvic: External genitalia:  no lesions              No abnormal inguinal  nodes palpated.              Urethra:  normal appearing urethra with no masses, tenderness or lesions              Bartholins and Skenes: normal                 Vagina: normal appearing vagina with normal color and discharge, no lesions              Cervix: no lesions              Pap taken: no Bimanual Exam:  Uterus:  normal size, contour, position, consistency, mobility, non-tender              Adnexa: no mass, fullness, tenderness              Rectal exam: yes.  Confirms.              Anus:  normal sphincter tone, no lesions  Chaperone was present for exam.  Assessment:   Screening breast exam.  FH breast cancer.  Postmenopausal. Status post left breast biopsy - fibrocystic change with adenosis.  HSV 1.  Medication monitoring.  Osteopenia. Pelvic exam with abnormal findings absent.  Remote hx of HPV.  Status post cryotherapy. Rectal exam.   Plan: Mammogram screening discussed. Self breast awareness reviewed. Pap next year.  Guidelines for Calcium, Vitamin D, regular exercise program including cardiovascular and weight bearing exercise. Anticipated BMD in 2 years.  Will need to review report formally and contact patient after it arrives in office.  I discussed genetic counseling and testing.  Benefits to self and family.  We reviewed potential yearly breast MRI if lifetime risk of breast cancer is elevated.  She willconsider this.  Refill of Valtrex.  Follow up annually and prn.   After visit summary provided.   33 min total time was spent for this patient encounter, including preparation, face-to-face counseling with the patient, coordination of care, and documentation of the encounter.

## 2021-01-14 ENCOUNTER — Ambulatory Visit: Payer: Medicare Other | Admitting: Obstetrics and Gynecology

## 2021-01-14 ENCOUNTER — Encounter: Payer: Self-pay | Admitting: Obstetrics and Gynecology

## 2021-01-14 ENCOUNTER — Telehealth: Payer: Self-pay | Admitting: Obstetrics and Gynecology

## 2021-01-14 ENCOUNTER — Other Ambulatory Visit: Payer: Self-pay

## 2021-01-14 ENCOUNTER — Ambulatory Visit (INDEPENDENT_AMBULATORY_CARE_PROVIDER_SITE_OTHER): Payer: Medicare Other | Admitting: Obstetrics and Gynecology

## 2021-01-14 VITALS — BP 150/82 | HR 82 | Ht 66.25 in | Wt 181.0 lb

## 2021-01-14 DIAGNOSIS — M8580 Other specified disorders of bone density and structure, unspecified site: Secondary | ICD-10-CM | POA: Diagnosis not present

## 2021-01-14 DIAGNOSIS — Z9289 Personal history of other medical treatment: Secondary | ICD-10-CM

## 2021-01-14 DIAGNOSIS — Z1239 Encounter for other screening for malignant neoplasm of breast: Secondary | ICD-10-CM

## 2021-01-14 DIAGNOSIS — Z803 Family history of malignant neoplasm of breast: Secondary | ICD-10-CM | POA: Diagnosis not present

## 2021-01-14 DIAGNOSIS — B009 Herpesviral infection, unspecified: Secondary | ICD-10-CM | POA: Diagnosis not present

## 2021-01-14 DIAGNOSIS — Z01419 Encounter for gynecological examination (general) (routine) without abnormal findings: Secondary | ICD-10-CM

## 2021-01-14 DIAGNOSIS — Z5181 Encounter for therapeutic drug level monitoring: Secondary | ICD-10-CM | POA: Diagnosis not present

## 2021-01-14 DIAGNOSIS — Z008 Encounter for other general examination: Secondary | ICD-10-CM

## 2021-01-14 MED ORDER — VALACYCLOVIR HCL 1 G PO TABS: ORAL_TABLET | ORAL | 2 refills | Status: AC

## 2021-01-14 NOTE — Patient Instructions (Signed)
EXERCISE AND DIET:  We recommended that you start or continue a regular exercise program for good health. Regular exercise means any activity that makes your heart beat faster and makes you sweat.  We recommend exercising at least 30 minutes per day at least 3 days a week, preferably 4 or 5.  We also recommend a diet low in fat and sugar.  Inactivity, poor dietary choices and obesity can cause diabetes, heart attack, stroke, and kidney damage, among others.    ALCOHOL AND SMOKING:  Women should limit their alcohol intake to no more than 7 drinks/beers/glasses of wine (combined, not each!) per week. Moderation of alcohol intake to this level decreases your risk of breast cancer and liver damage. And of course, no recreational drugs are part of a healthy lifestyle.  And absolutely no smoking or even second hand smoke. Most people know smoking can cause heart and lung diseases, but did you know it also contributes to weakening of your bones? Aging of your skin?  Yellowing of your teeth and nails?  CALCIUM AND VITAMIN D:  Adequate intake of calcium and Vitamin D are recommended.  The recommendations for exact amounts of these supplements seem to change often, but generally speaking 600 mg of calcium (either carbonate or citrate) and 800 units of Vitamin D per day seems prudent. Certain women may benefit from higher intake of Vitamin D.  If you are among these women, your doctor will have told you during your visit.    PAP SMEARS:  Pap smears, to check for cervical cancer or precancers,  have traditionally been done yearly, although recent scientific advances have shown that most women can have pap smears less often.  However, every woman still should have a physical exam from her gynecologist every year. It will include a breast check, inspection of the vulva and vagina to check for abnormal growths or skin changes, a visual exam of the cervix, and then an exam to evaluate the size and shape of the uterus and  ovaries.  And after 74 years of age, a rectal exam is indicated to check for rectal cancers. We will also provide age appropriate advice regarding health maintenance, like when you should have certain vaccines, screening for sexually transmitted diseases, bone density testing, colonoscopy, mammograms, etc.   MAMMOGRAMS:  All women over 52 years old should have a yearly mammogram. Many facilities now offer a "3D" mammogram, which may cost around $50 extra out of pocket. If possible,  we recommend you accept the option to have the 3D mammogram performed.  It both reduces the number of women who will be called back for extra views which then turn out to be normal, and it is better than the routine mammogram at detecting truly abnormal areas.    COLONOSCOPY:  Colonoscopy to screen for colon cancer is recommended for all women at age 18.  We know, you hate the idea of the prep.  We agree, BUT, having colon cancer and not knowing it is worse!!  Colon cancer so often starts as a polyp that can be seen and removed at colonscopy, which can quite literally save your life!  And if your first colonoscopy is normal and you have no family history of colon cancer, most women don't have to have it again for 10 years.  Once every ten years, you can do something that may end up saving your life, right?  We will be happy to help you get it scheduled when you are ready.  Be sure to check your insurance coverage so you understand how much it will cost.  It may be covered as a preventative service at no cost, but you should check your particular policy.    BRCA Gene Test Why am I having this test? A BRCA gene (BReast CAncer gene) test is done to check for the presence of harmful changes (mutations) in the BRCA1 gene or the BRCA2 gene. Both of these genes are called breast cancer susceptibility genes. If there is a mutation, the genes may not be able to help repair damaged cells in the body. As a result, the damaged cells  maydevelop defects that can lead to certain types of cancer. You may have this test if you have a family history of certain types of cancer, including cancer of the breast, ovaries, fallopian tubes, peritoneum, pancreas,or prostate. What is being tested? This test checks the BRCA1 and BRCA2 genes for mutations. What kind of sample is taken?     The test requires either a sample of blood or a sample of cells from your saliva. If a sample of blood is needed, it will be collected by inserting a needle into a vein. If a sample of saliva is needed, you will be given instructions on how to collect the sample. How are the results reported? Your test results will be reported as negative, positive, or less commonly as avariant of unknown significance (VUS). What do the results mean? The test results can show whether you have a mutation in the BRCA1 or BRCA2 gene that increases your risk for certain cancers, or if you have a mutation in the BRCA1 or BRCA2 gene that is not associated with an increased risk of cancer at this time. A negative test result means that you do not have a mutation in the BRCA1 or BRCA2 gene that is known to increase your risk for certain cancers. However, it is still possible to test positive for cancer in the future. A positive test result means that you have a mutation in the BRCA1 or BRCA2 gene that increases your risk for certain cancers. Women with a positive test result have an increased risk for breast and ovarian cancer. Both women and men with a mutation have an increased risk for breast cancer and may be at greater risk for other types of cancer. However, getting a positive test result does not mean that you will develop cancer. A BRCA1 or BRCA2 gene mutation means that you have a risk of passing it along to your children. A variant of unknown significance (VUS) means there is a change in the BRCA1 or BRCA2 gene, but it is a change that has not been linked to cancer. As  more research is done and more is learned about these mutations, they are often reclassified as positive, with a link to a particular cancer or cancers, or negative, with no link to a cancer or cancers. Talk with your health care provider or genetic counselor about what yourresults mean. Questions to ask your health care provider Ask your health care provider, or the department that is doing the test: When will my results be ready? How will I get my results? What are my treatment options? What other tests do I need? What are my next steps? Summary BRCA gene (BReast CAncer gene) testing is done to check for the presence of harmful changes (mutations) in the BRCA1 gene or the BRCA2 gene. The BRCA1 and BRCA2 genes are called breast cancer susceptibility genes. You  may have this test if you have a family history of certain types of cancer, including cancer of the breast, ovaries, fallopian tubes, peritoneum, pancreas, or prostate. The test requires either a sample of blood or a sample of cells from your saliva. Your test results will be reported as negative, positive, or as a variant of unknown significance. Talk with your health care provider or genetic counselor about what your results mean. This information is not intended to replace advice given to you by your health care provider. Make sure you discuss any questions you have with your healthcare provider. Document Revised: 02/01/2020 Document Reviewed: 02/01/2020 Elsevier Patient Education  2022 [employer].

## 2021-01-14 NOTE — Telephone Encounter (Signed)
Please contact patient with results of her bone density from [facility] showing osteopenia of the hips.  She does not have osteoporosis.   Her calculated future risk of fracture by a model called FRAX indicates she is at increased risk of fracture of the hip at 3.9% over the next 10 years.  Specific prescription bone building medication may be indicated for her to start. One of the medications called Evista also reduces risk of breast cancer.   1200 mg of calcium daily in divided doses, at least 800 IU of vitamin D daily, and weight bearing exercise will help to maintain bone density and strength.  Please offer her an office visit if she would like to discuss prescription treatment options.

## 2021-01-15 NOTE — Telephone Encounter (Signed)
Left message for patient to call.

## 2021-01-24 NOTE — Telephone Encounter (Signed)
Patient informed with below note, she asked if I could send a my chart message.

## 2021-02-12 DIAGNOSIS — D2271 Melanocytic nevi of right lower limb, including hip: Secondary | ICD-10-CM | POA: Diagnosis not present

## 2021-02-12 DIAGNOSIS — Z85828 Personal history of other malignant neoplasm of skin: Secondary | ICD-10-CM | POA: Diagnosis not present

## 2021-02-12 DIAGNOSIS — L821 Other seborrheic keratosis: Secondary | ICD-10-CM | POA: Diagnosis not present

## 2021-02-12 DIAGNOSIS — L578 Other skin changes due to chronic exposure to nonionizing radiation: Secondary | ICD-10-CM | POA: Diagnosis not present

## 2021-02-12 DIAGNOSIS — D2261 Melanocytic nevi of right upper limb, including shoulder: Secondary | ICD-10-CM | POA: Diagnosis not present

## 2021-02-12 DIAGNOSIS — L719 Rosacea, unspecified: Secondary | ICD-10-CM | POA: Diagnosis not present

## 2021-02-15 DIAGNOSIS — H2512 Age-related nuclear cataract, left eye: Secondary | ICD-10-CM | POA: Diagnosis not present

## 2021-02-21 DIAGNOSIS — H2512 Age-related nuclear cataract, left eye: Secondary | ICD-10-CM | POA: Diagnosis not present

## 2021-04-19 DIAGNOSIS — Z23 Encounter for immunization: Secondary | ICD-10-CM | POA: Diagnosis not present

## 2021-05-14 DIAGNOSIS — D485 Neoplasm of uncertain behavior of skin: Secondary | ICD-10-CM | POA: Diagnosis not present

## 2021-05-14 DIAGNOSIS — C44519 Basal cell carcinoma of skin of other part of trunk: Secondary | ICD-10-CM | POA: Diagnosis not present

## 2021-05-28 DIAGNOSIS — H1033 Unspecified acute conjunctivitis, bilateral: Secondary | ICD-10-CM | POA: Diagnosis not present

## 2021-05-28 DIAGNOSIS — J069 Acute upper respiratory infection, unspecified: Secondary | ICD-10-CM | POA: Diagnosis not present

## 2021-07-04 DIAGNOSIS — C44519 Basal cell carcinoma of skin of other part of trunk: Secondary | ICD-10-CM | POA: Diagnosis not present

## 2022-01-13 DIAGNOSIS — Z1231 Encounter for screening mammogram for malignant neoplasm of breast: Secondary | ICD-10-CM | POA: Diagnosis not present

## 2022-01-15 ENCOUNTER — Encounter: Payer: Self-pay | Admitting: Obstetrics and Gynecology

## 2022-02-18 DIAGNOSIS — Z85828 Personal history of other malignant neoplasm of skin: Secondary | ICD-10-CM | POA: Diagnosis not present

## 2022-02-18 DIAGNOSIS — D2271 Melanocytic nevi of right lower limb, including hip: Secondary | ICD-10-CM | POA: Diagnosis not present

## 2022-02-18 DIAGNOSIS — L719 Rosacea, unspecified: Secondary | ICD-10-CM | POA: Diagnosis not present

## 2022-02-18 DIAGNOSIS — D2261 Melanocytic nevi of right upper limb, including shoulder: Secondary | ICD-10-CM | POA: Diagnosis not present

## 2022-02-18 DIAGNOSIS — L578 Other skin changes due to chronic exposure to nonionizing radiation: Secondary | ICD-10-CM | POA: Diagnosis not present

## 2022-02-18 DIAGNOSIS — D229 Melanocytic nevi, unspecified: Secondary | ICD-10-CM | POA: Diagnosis not present

## 2022-02-18 DIAGNOSIS — L820 Inflamed seborrheic keratosis: Secondary | ICD-10-CM | POA: Diagnosis not present

## 2022-02-18 DIAGNOSIS — L821 Other seborrheic keratosis: Secondary | ICD-10-CM | POA: Diagnosis not present

## 2022-03-12 DIAGNOSIS — E669 Obesity, unspecified: Secondary | ICD-10-CM | POA: Diagnosis not present

## 2022-03-12 DIAGNOSIS — Z86010 Personal history of colonic polyps: Secondary | ICD-10-CM | POA: Diagnosis not present

## 2022-03-12 DIAGNOSIS — K219 Gastro-esophageal reflux disease without esophagitis: Secondary | ICD-10-CM | POA: Diagnosis not present

## 2022-03-12 DIAGNOSIS — K5730 Diverticulosis of large intestine without perforation or abscess without bleeding: Secondary | ICD-10-CM | POA: Diagnosis not present

## 2022-03-12 DIAGNOSIS — Z1211 Encounter for screening for malignant neoplasm of colon: Secondary | ICD-10-CM | POA: Diagnosis not present

## 2022-03-12 DIAGNOSIS — Z800 Family history of malignant neoplasm of digestive organs: Secondary | ICD-10-CM | POA: Diagnosis not present

## 2022-03-13 DIAGNOSIS — Z800 Family history of malignant neoplasm of digestive organs: Secondary | ICD-10-CM | POA: Diagnosis not present

## 2022-03-13 DIAGNOSIS — Z1211 Encounter for screening for malignant neoplasm of colon: Secondary | ICD-10-CM | POA: Diagnosis not present

## 2022-03-13 DIAGNOSIS — K219 Gastro-esophageal reflux disease without esophagitis: Secondary | ICD-10-CM | POA: Diagnosis not present

## 2022-03-13 DIAGNOSIS — K5730 Diverticulosis of large intestine without perforation or abscess without bleeding: Secondary | ICD-10-CM | POA: Diagnosis not present

## 2022-03-13 DIAGNOSIS — R945 Abnormal results of liver function studies: Secondary | ICD-10-CM | POA: Diagnosis not present

## 2022-03-13 DIAGNOSIS — E669 Obesity, unspecified: Secondary | ICD-10-CM | POA: Diagnosis not present

## 2022-03-13 DIAGNOSIS — Z86010 Personal history of colonic polyps: Secondary | ICD-10-CM | POA: Diagnosis not present

## 2022-03-19 DIAGNOSIS — E669 Obesity, unspecified: Secondary | ICD-10-CM | POA: Diagnosis not present

## 2022-03-19 DIAGNOSIS — Z6829 Body mass index (BMI) 29.0-29.9, adult: Secondary | ICD-10-CM | POA: Diagnosis not present

## 2022-03-19 DIAGNOSIS — R945 Abnormal results of liver function studies: Secondary | ICD-10-CM | POA: Diagnosis not present

## 2022-03-19 DIAGNOSIS — K760 Fatty (change of) liver, not elsewhere classified: Secondary | ICD-10-CM | POA: Diagnosis not present

## 2022-04-24 DIAGNOSIS — Z23 Encounter for immunization: Secondary | ICD-10-CM | POA: Diagnosis not present

## 2022-05-02 DIAGNOSIS — Z800 Family history of malignant neoplasm of digestive organs: Secondary | ICD-10-CM | POA: Diagnosis not present

## 2022-05-02 DIAGNOSIS — Z86010 Personal history of colonic polyps: Secondary | ICD-10-CM | POA: Diagnosis not present

## 2022-05-02 DIAGNOSIS — K5730 Diverticulosis of large intestine without perforation or abscess without bleeding: Secondary | ICD-10-CM | POA: Diagnosis not present

## 2022-05-02 DIAGNOSIS — Z1211 Encounter for screening for malignant neoplasm of colon: Secondary | ICD-10-CM | POA: Diagnosis not present

## 2022-05-15 DIAGNOSIS — Z23 Encounter for immunization: Secondary | ICD-10-CM | POA: Diagnosis not present

## 2022-08-29 DIAGNOSIS — M25561 Pain in right knee: Secondary | ICD-10-CM | POA: Diagnosis not present

## 2022-08-29 DIAGNOSIS — K760 Fatty (change of) liver, not elsewhere classified: Secondary | ICD-10-CM | POA: Diagnosis not present

## 2022-08-29 DIAGNOSIS — Z6829 Body mass index (BMI) 29.0-29.9, adult: Secondary | ICD-10-CM | POA: Diagnosis not present

## 2022-08-29 DIAGNOSIS — K30 Functional dyspepsia: Secondary | ICD-10-CM | POA: Diagnosis not present

## 2022-08-29 DIAGNOSIS — Z0000 Encounter for general adult medical examination without abnormal findings: Secondary | ICD-10-CM | POA: Diagnosis not present

## 2022-08-29 DIAGNOSIS — Z13220 Encounter for screening for lipoid disorders: Secondary | ICD-10-CM | POA: Diagnosis not present

## 2022-08-29 DIAGNOSIS — Z23 Encounter for immunization: Secondary | ICD-10-CM | POA: Diagnosis not present

## 2022-09-02 DIAGNOSIS — Z13220 Encounter for screening for lipoid disorders: Secondary | ICD-10-CM | POA: Diagnosis not present

## 2022-09-02 DIAGNOSIS — K760 Fatty (change of) liver, not elsewhere classified: Secondary | ICD-10-CM | POA: Diagnosis not present

## 2022-09-02 DIAGNOSIS — Z23 Encounter for immunization: Secondary | ICD-10-CM | POA: Diagnosis not present

## 2022-09-16 DIAGNOSIS — Z6829 Body mass index (BMI) 29.0-29.9, adult: Secondary | ICD-10-CM | POA: Diagnosis not present

## 2022-09-16 DIAGNOSIS — R238 Other skin changes: Secondary | ICD-10-CM | POA: Diagnosis not present

## 2022-09-18 DIAGNOSIS — M25561 Pain in right knee: Secondary | ICD-10-CM | POA: Diagnosis not present

## 2022-09-22 DIAGNOSIS — R7989 Other specified abnormal findings of blood chemistry: Secondary | ICD-10-CM | POA: Diagnosis not present

## 2022-09-22 DIAGNOSIS — R7309 Other abnormal glucose: Secondary | ICD-10-CM | POA: Diagnosis not present

## 2022-12-22 DIAGNOSIS — M25561 Pain in right knee: Secondary | ICD-10-CM | POA: Diagnosis not present

## 2023-01-19 DIAGNOSIS — Z1231 Encounter for screening mammogram for malignant neoplasm of breast: Secondary | ICD-10-CM | POA: Diagnosis not present

## 2023-02-10 DIAGNOSIS — N958 Other specified menopausal and perimenopausal disorders: Secondary | ICD-10-CM | POA: Diagnosis not present

## 2023-02-10 DIAGNOSIS — M8588 Other specified disorders of bone density and structure, other site: Secondary | ICD-10-CM | POA: Diagnosis not present

## 2023-02-11 ENCOUNTER — Encounter: Payer: Self-pay | Admitting: Obstetrics and Gynecology

## 2023-02-12 ENCOUNTER — Encounter: Payer: Self-pay | Admitting: Obstetrics and Gynecology

## 2023-02-17 ENCOUNTER — Other Ambulatory Visit: Payer: Self-pay | Admitting: Obstetrics and Gynecology

## 2023-02-19 DIAGNOSIS — D229 Melanocytic nevi, unspecified: Secondary | ICD-10-CM | POA: Diagnosis not present

## 2023-02-19 DIAGNOSIS — D2261 Melanocytic nevi of right upper limb, including shoulder: Secondary | ICD-10-CM | POA: Diagnosis not present

## 2023-02-19 DIAGNOSIS — D485 Neoplasm of uncertain behavior of skin: Secondary | ICD-10-CM | POA: Diagnosis not present

## 2023-02-19 DIAGNOSIS — Z85828 Personal history of other malignant neoplasm of skin: Secondary | ICD-10-CM | POA: Diagnosis not present

## 2023-02-19 DIAGNOSIS — L578 Other skin changes due to chronic exposure to nonionizing radiation: Secondary | ICD-10-CM | POA: Diagnosis not present

## 2023-02-19 DIAGNOSIS — D2271 Melanocytic nevi of right lower limb, including hip: Secondary | ICD-10-CM | POA: Diagnosis not present

## 2023-02-19 DIAGNOSIS — L820 Inflamed seborrheic keratosis: Secondary | ICD-10-CM | POA: Diagnosis not present

## 2023-02-19 DIAGNOSIS — L821 Other seborrheic keratosis: Secondary | ICD-10-CM | POA: Diagnosis not present

## 2023-04-20 ENCOUNTER — Emergency Department (HOSPITAL_COMMUNITY): Payer: Medicare Other

## 2023-04-20 ENCOUNTER — Encounter (HOSPITAL_COMMUNITY): Payer: Self-pay

## 2023-04-20 ENCOUNTER — Observation Stay (HOSPITAL_COMMUNITY)
Admission: EM | Admit: 2023-04-20 | Discharge: 2023-04-21 | Disposition: A | Discharge status: Home / Self Care | Payer: Medicare Other | Attending: Cardiology | Admitting: Cardiology

## 2023-04-20 ENCOUNTER — Other Ambulatory Visit: Payer: Self-pay

## 2023-04-20 DIAGNOSIS — I4891 Unspecified atrial fibrillation: Principal | ICD-10-CM | POA: Diagnosis present

## 2023-04-20 DIAGNOSIS — R0602 Shortness of breath: Secondary | ICD-10-CM | POA: Diagnosis not present

## 2023-04-20 DIAGNOSIS — R42 Dizziness and giddiness: Secondary | ICD-10-CM | POA: Diagnosis not present

## 2023-04-20 DIAGNOSIS — R002 Palpitations: Secondary | ICD-10-CM | POA: Diagnosis present

## 2023-04-20 DIAGNOSIS — I499 Cardiac arrhythmia, unspecified: Secondary | ICD-10-CM | POA: Diagnosis not present

## 2023-04-20 DIAGNOSIS — I739 Peripheral vascular disease, unspecified: Secondary | ICD-10-CM | POA: Diagnosis not present

## 2023-04-20 DIAGNOSIS — R739 Hyperglycemia, unspecified: Secondary | ICD-10-CM

## 2023-04-20 DIAGNOSIS — K219 Gastro-esophageal reflux disease without esophagitis: Secondary | ICD-10-CM

## 2023-04-20 DIAGNOSIS — Z23 Encounter for immunization: Secondary | ICD-10-CM | POA: Insufficient documentation

## 2023-04-20 DIAGNOSIS — E785 Hyperlipidemia, unspecified: Secondary | ICD-10-CM | POA: Diagnosis not present

## 2023-04-20 DIAGNOSIS — R7303 Prediabetes: Secondary | ICD-10-CM | POA: Insufficient documentation

## 2023-04-20 DIAGNOSIS — Z6829 Body mass index (BMI) 29.0-29.9, adult: Secondary | ICD-10-CM | POA: Diagnosis not present

## 2023-04-20 DIAGNOSIS — R079 Chest pain, unspecified: Secondary | ICD-10-CM | POA: Diagnosis not present

## 2023-04-20 DIAGNOSIS — R010 Benign and innocent cardiac murmurs: Secondary | ICD-10-CM | POA: Diagnosis not present

## 2023-04-20 DIAGNOSIS — Z7901 Long term (current) use of anticoagulants: Secondary | ICD-10-CM | POA: Diagnosis not present

## 2023-04-20 DIAGNOSIS — E7800 Pure hypercholesterolemia, unspecified: Secondary | ICD-10-CM

## 2023-04-20 DIAGNOSIS — R000 Tachycardia, unspecified: Secondary | ICD-10-CM | POA: Diagnosis not present

## 2023-04-20 DIAGNOSIS — I480 Paroxysmal atrial fibrillation: Principal | ICD-10-CM

## 2023-04-20 LAB — CBC
HCT: 45.2 % (ref 36.0–46.0)
Hemoglobin: 15.6 g/dL — ABNORMAL HIGH (ref 12.0–15.0)
MCH: 31.1 pg (ref 26.0–34.0)
MCHC: 34.5 g/dL (ref 30.0–36.0)
MCV: 90.2 fL (ref 80.0–100.0)
Platelets: 202 10*3/uL (ref 150–400)
RBC: 5.01 MIL/uL (ref 3.87–5.11)
RDW: 12.4 % (ref 11.5–15.5)
WBC: 20 10*3/uL — ABNORMAL HIGH (ref 4.0–10.5)
nRBC: 0 % (ref 0.0–0.2)

## 2023-04-20 LAB — URINALYSIS, ROUTINE W REFLEX MICROSCOPIC
Bilirubin Urine: NEGATIVE
Glucose, UA: NEGATIVE mg/dL
Hgb urine dipstick: NEGATIVE
Ketones, ur: 5 mg/dL — AB
Nitrite: NEGATIVE
Protein, ur: NEGATIVE mg/dL
Specific Gravity, Urine: 1.015 (ref 1.005–1.030)
pH: 6 (ref 5.0–8.0)

## 2023-04-20 LAB — HEMOGLOBIN A1C
Hgb A1c MFr Bld: 6 % — ABNORMAL HIGH (ref 4.8–5.6)
Mean Plasma Glucose: 125.5 mg/dL

## 2023-04-20 LAB — BASIC METABOLIC PANEL
Anion gap: 11 (ref 5–15)
BUN: 11 mg/dL (ref 8–23)
CO2: 21 mmol/L — ABNORMAL LOW (ref 22–32)
Calcium: 9 mg/dL (ref 8.9–10.3)
Chloride: 102 mmol/L (ref 98–111)
Creatinine, Ser: 0.8 mg/dL (ref 0.44–1.00)
GFR, Estimated: >60 mL/min (ref >60)
Glucose, Bld: 182 mg/dL — ABNORMAL HIGH (ref 70–99)
Potassium: 4 mmol/L (ref 3.5–5.1)
Sodium: 134 mmol/L — ABNORMAL LOW (ref 135–145)

## 2023-04-20 LAB — TROPONIN I (HIGH SENSITIVITY)
Troponin I (High Sensitivity): 20 ng/L — ABNORMAL HIGH (ref <18)
Troponin I (High Sensitivity): 21 ng/L — ABNORMAL HIGH (ref <18)

## 2023-04-20 LAB — TSH: TSH: 2.852 u[IU]/mL (ref 0.350–4.500)

## 2023-04-20 LAB — BRAIN NATRIURETIC PEPTIDE: B Natriuretic Peptide: 172.6 pg/mL — ABNORMAL HIGH (ref 0.0–100.0)

## 2023-04-20 LAB — MAGNESIUM: Magnesium: 1.9 mg/dL (ref 1.7–2.4)

## 2023-04-20 MED ORDER — INFLUENZA VAC A&B SURF ANT ADJ 0.5 ML IM SUSY
0.5000 mL | PREFILLED_SYRINGE | INTRAMUSCULAR | Status: AC
  Administered 2023-04-21: 0.5 mL via INTRAMUSCULAR
  Filled 2023-04-20: qty 0.5

## 2023-04-20 MED ORDER — APIXABAN 5 MG PO TABS
5.0000 mg | ORAL_TABLET | Freq: Two times a day (BID) | ORAL | Status: DC
  Administered 2023-04-20 – 2023-04-21 (×2): 5 mg via ORAL
  Filled 2023-04-20 (×2): qty 1

## 2023-04-20 MED ORDER — ACETAMINOPHEN 325 MG PO TABS: 650.0000 mg | ORAL_TABLET | ORAL | Status: DC | PRN

## 2023-04-20 MED ORDER — METOPROLOL TARTRATE 25 MG PO TABS
25.0000 mg | ORAL_TABLET | Freq: Two times a day (BID) | ORAL | Status: DC
  Administered 2023-04-20 – 2023-04-21 (×2): 25 mg via ORAL
  Filled 2023-04-20 (×2): qty 1

## 2023-04-20 MED ORDER — ALUM & MAG HYDROXIDE-SIMETH 200-200-20 MG/5ML PO SUSP
30.0000 mL | ORAL | Status: DC | PRN
  Administered 2023-04-20 – 2023-04-21 (×2): 30 mL via ORAL
  Filled 2023-04-20 (×3): qty 30

## 2023-04-20 MED ORDER — DILTIAZEM HCL 30 MG PO TABS
30.0000 mg | ORAL_TABLET | Freq: Four times a day (QID) | ORAL | Status: DC
  Administered 2023-04-20 – 2023-04-21 (×3): 30 mg via ORAL
  Filled 2023-04-20 (×4): qty 1

## 2023-04-20 MED ORDER — METOPROLOL TARTRATE 5 MG/5ML IV SOLN
2.5000 mg | INTRAVENOUS | Status: AC
  Administered 2023-04-20 (×2): 2.5 mg via INTRAVENOUS
  Filled 2023-04-20: qty 5

## 2023-04-20 MED ORDER — ONDANSETRON HCL 4 MG/2ML IJ SOLN: 4.0000 mg | Freq: Four times a day (QID) | INTRAMUSCULAR | Status: DC | PRN

## 2023-04-20 MED ORDER — SODIUM CHLORIDE 0.9 % IV BOLUS
1000.0000 mL | Freq: Once | INTRAVENOUS | Status: AC
  Administered 2023-04-20: 1000 mL via INTRAVENOUS

## 2023-04-20 NOTE — Progress Notes (Addendum)
Patient Name: Adriana Cook Date of Encounter: 04/20/2023 [facility] Cardiologist: None   Interval Summary  .    Adriana Cook is a  Very active Caucasian female patient with no significant prior cardiovascular history, not on any cardiac medications except for ezetimibe, hyperglycemia and GERD was seen by her PCP for Dizziness and nausea that started last Saturday that is 3 days ago.  Patient has been having symptoms of GERD for almost a year but had recently switched from PPI to Pepcid which initially worked over the last 3 months she has had labs and states that she is having terrible acid reflux.    Currently not on any PPI medications.  In view of new onset atrial fibrillation with RVR and chest pain, she was advised to go to the emergency room.  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  Denies chest pain, shortness of breath, palpitations, dizziness or syncope.  No leg edema, no recent change in weight.  Vital Signs .    Vitals:   04/20/23 1600 04/20/23 1630 04/20/23 1645 04/20/23 1700  BP: 126/80 124/89 119/62 134/86  Pulse: (!) 105 (!) 54 (!) 127 (!) 135  Resp: 15 15 (!) 27 18  SpO2: 98% 98% 90% 97%  Weight:      Height:       No intake or output data in the 24 hours ending 04/20/23 1710    04/20/2023    3:16 PM 01/14/2021   12:02 PM 01/09/2020    1:04 PM  Last 3 Weights  Weight (lbs) 181 lb 181 lb 184 lb 9.6 oz  Weight (kg) 82.1 kg 82.101 kg 83.734 kg     Review of Systems  Cardiovascular:  Negative for chest pain, dyspnea on exertion and leg swelling.  Musculoskeletal:  Positive for muscle cramps (both legs at night).  Neurological:  Positive for dizziness.   Physical Exam .   Physical Exam Neck:     Vascular: No carotid bruit or JVD.  Cardiovascular:     Rate and Rhythm: Tachycardia present. Rhythm irregular.      Pulses:          Femoral pulses are 2+ on the right side and 2+ on the left side.      Popliteal pulses are 2+ on the right side and 1+ on the left side.       Dorsalis pedis pulses are 2+ on the right side and 0 on the left side.       Posterior tibial pulses are 2+ on the right side and 0 on the left side.     Heart sounds: Murmur heard.     Harsh early systolic murmur is present with a grade of 2/6 at the upper right sternal border and apex.  Pulmonary:     Effort: Pulmonary effort is normal.     Breath sounds: Normal breath sounds.  Abdominal:     General: Bowel sounds are normal.     Palpations: Abdomen is soft.  Musculoskeletal:     Right lower leg: No edema.     Left lower leg: No edema.  Skin:    Capillary Refill: Capillary refill takes less than 2 seconds.    Labs    Lab Results  Component Value Date   NA 134 (L) 04/20/2023   K 4.0 04/20/2023   CO2 21 (L)  04/20/2023   GLUCOSE 182 (H) 04/20/2023   BUN 11 04/20/2023   CREATININE 0.80 04/20/2023   CALCIUM 9.0 04/20/2023   GFRNONAA >60 04/20/2023   Lab Results  Component Value Date   WBC 20.0 (H) 04/20/2023   HGB 15.6 (H) 04/20/2023   HCT 45.2 04/20/2023   MCV 90.2 04/20/2023   PLT 202 04/20/2023   Cardiac Panel (last 3 results) Troponin (Point of Care Test) No results for input(s): "TROPIPOC" in the last 72 hours.  Cardiac Panel (last 3 results) Recent Labs    04/20/23 1601  TROPONINIHS 20*    CHA2DS2-VASc Score is 3.  Yearly risk of stroke: 3.2% (A, F).  Score of 1=0.6; 2=2.2; 3=3.2; 4=4.8; 5=7.2; 6=9.8; 7=>9.8) -(CHF; HTN; vasc disease DM,  Female = 1; Age <65 =0; 65-74 = 1,  >75 =2; stroke/embolism= 2).    Telemetry/ECG    EKG 04/20/2023: Atrial fibrillation with rapid ventricular response at rate of 124 bpm, normal axis.  Right bundle branch block.  No ST-T wave changes of ischemia. - Personally Reviewed   Assessment & Plan .     1.  New onset atrial fibrillation with rapid ventricular response,  duration unknown. Recommendation: Will admit the patient for observation/management of atrial fibrillation with RVR.  Her blood pressure is soft, she has no history of hypertension, I will use low-dose combination of beta-blocker and calcium channel blocker orally as she is completely asymptomatic and there is no clinical evidence of heart failure and there is no evidence of ischemia by EKG in spite of RVR.  Suspicion for CAD is low.  Cardiac markers are pending.  CHA2DS2-VASc score is 3.0, she will need to be on anticoagulation.  Will obtain echocardiogram.  2.  GERD Symptoms are well-controlled on PPI, since being on Pepcid she has had recurrence of acid reflux ongoing for the past 6 months especially last 3 months.  No change in symptoms of GERD hence do not suspect angina or anginal equivalent.  3.  Heart murmur, suspect mild aortic stenosis or aortic sclerosis.     4.  Hypercholesterolemia In view of hyperglycemia, age, I would also prefer to be on a statin, will check lipid profile testing, will continue Zetia for now.  5.  Peripheral arterial disease Her physical examination reveals absent pedal pulses on the left lower extremity and decreased pulse in the left popliteal artery otherwise normal.  Will obtain ABI to establish disease process for risk stratification.  If her urinalysis, labs are unremarkable, potentially can be discharged home tomorrow with outpatient follow-up.  For questions or updates, please contact  [facility] Please consult [email or web address] for contact info under      Yates Decamp, MD, [facility] 04/20/2023, 5:10 PM [facility] [address] [place], [place] [address] Phone: [phone]. Fax:  [phone] Jacinto Halim, MD, [facility] 04/20/2023, 5:10 PM

## 2023-04-20 NOTE — ED Triage Notes (Signed)
Pt coming in from [facility]. Pt lives at home alone. Pt is alert and oriented. Pt found to be in afib with a rate in the 180s by ems. Pt was given 10 of diltiazem. And pt rate returned to 140s.

## 2023-04-20 NOTE — ED Provider Notes (Signed)
[place] EMERGENCY DEPARTMENT AT [facility] Provider Note   CSN: [id number] Arrival date & time: 04/20/23  1507     History  Chief Complaint  Patient presents with   Palpitations    Adriana Cook is a 77 y.o. female with medical history significant for hyperlipidemia, GERD, HSV infection who presents from [place] [facility].  Patient lives at home alone, she reports that she has been dizzy for at least a week if not more, she also endorses some chest discomfort that she initially attributed to GERD.  Patient was found to be in new onset A-fib RVR at PCP.  Received 10 mg diltiazem en route by EMS, blood pressure improved, and heart rate improved from around 180s to around 140s.  She denies feeling sick recently, fever, chills, she denies any previous similar rhythms.  She denies any history of ACS, stroke, she does not take a blood thinner.   Palpitations      Home Medications Prior to Admission medications   Medication Sig Start Date End Date Taking? Authorizing Provider  Calcium Carb-Cholecalciferol (CALCIUM 500 + D) 500-200 MG-UNIT TABS Take 1 tablet by mouth daily.    [provider]  Cholecalciferol (VITAMIN D-3) 125 MCG (5000 UT) TABS Take 1 capsule by mouth daily.    [provider]  Misc Natural Products (LUTEIN 20 PO) Take 1 capsule by mouth daily.    [provider]  Multiple Vitamin (MULTI-VITAMIN DAILY PO) Take by mouth daily.    [provider]  valACYclovir (VALTREX) 1000 MG tablet Take 2 tablets (2000 mg) by mouth every 12 hours for 24 hours as needed. 01/14/21   Patton Salles, MD      Allergies    Penicillins    Review of Systems   Review of Systems  Cardiovascular:  Positive for palpitations.  All other systems reviewed and are negative.   Physical Exam Updated Vital Signs BP 134/86   Pulse (!) 135   Resp 18   Ht 5\' 6"  (1.676 m)   Wt 82.1 kg   LMP 06/30/1996   SpO2 97%   BMI 29.21 kg/m   Physical Exam Vitals and nursing note reviewed.  Constitutional:      General: She is not in acute distress.    Appearance: Normal appearance.  HENT:     Head: Normocephalic and atraumatic.  Eyes:     General:        Right eye: No discharge.        Left eye: No discharge.  Cardiovascular:     Rate and Rhythm: Tachycardia present. Rhythm irregular.     Heart sounds: Murmur heard.     No friction rub. No gallop.  Pulmonary:     Effort: Pulmonary effort is normal.     Breath sounds: Normal breath sounds.  Abdominal:     General: Bowel sounds are normal.     Palpations: Abdomen is soft.  Skin:    General: Skin is warm and dry.     Capillary Refill: Capillary refill takes less than 2 seconds.  Neurological:     Mental Status: She is alert and oriented to person, place, and time.  Psychiatric:        Mood and Affect: Mood normal.        Behavior: Behavior normal.     ED Results / Procedures / Treatments   Labs (all labs ordered are listed, but only abnormal results are displayed) Labs Reviewed  CBC - Abnormal; Notable for the following components:      Result Value   WBC 20.0 (*)    Hemoglobin 15.6 (*)    All other components within normal limits  BASIC METABOLIC PANEL - Abnormal; Notable for the following components:   Sodium 134 (*)    CO2 21 (*)    Glucose, Bld 182 (*)    All other components within normal limits  BRAIN NATRIURETIC PEPTIDE - Abnormal; Notable for the following components:   B Natriuretic Peptide 172.6 (*)    All other components within normal limits  TROPONIN I (HIGH SENSITIVITY) - Abnormal; Notable for the following components:   Troponin I (High Sensitivity) 20 (*)    All other components within normal limits  MAGNESIUM  TSH  URINALYSIS, ROUTINE W REFLEX MICROSCOPIC  HEMOGLOBIN A1C  LIPID PANEL  TROPONIN I (HIGH SENSITIVITY)    EKG EKG Interpretation Date/Time:  Monday April 20 2023 15:15:43 EDT Ventricular Rate:  124 PR Interval:     QRS Duration:  132 QT Interval:  332 QTC Calculation: 433 R Axis:   24  Text Interpretation: Atrial fibrillation Ventricular premature complex Right bundle branch block no prior ECG for comparison. new AFIB No STEMI Confirmed by Theda Belfast ([id number]) on 04/20/2023 3:33:20 PM  Radiology No results found.  Procedures .Critical Care  Performed by: Olene Floss, PA-C Authorized by: Olene Floss, PA-C   Critical care provider statement:    Critical care time (minutes):  35   Critical care was necessary to treat or prevent imminent or life-threatening deterioration of the following conditions:  Cardiac failure (A-fib, RVR, considered cardioversion)   Critical care was time spent personally by me on the following activities:  Development of treatment plan with patient or surrogate, discussions with consultants, evaluation of patient's response to treatment, examination of patient, ordering and review of laboratory studies, ordering and review of radiographic studies, ordering and performing treatments and interventions, pulse oximetry, re-evaluation of patient's condition and review of old charts   Care discussed with: admitting provider       Medications Ordered in ED Medications  acetaminophen (TYLENOL) tablet 650 mg (has no administration in time range)  ondansetron (ZOFRAN) injection 4 mg (has no administration in time range)  apixaban (ELIQUIS) tablet 5 mg (has no administration in time range)  metoprolol tartrate (LOPRESSOR) injection 2.5 mg (has no administration in time range)  metoprolol tartrate (LOPRESSOR) tablet 25 mg (has no administration in time range)  diltiazem (CARDIZEM) tablet 30 mg (has no administration in time range)  sodium chloride 0.9 % bolus 1,000 mL (1,000 mLs Intravenous New Bag/Given 04/20/23 1605)    ED Course/ Medical Decision Making/ A&P Clinical Course as of 04/20/23 1727  Mon Apr 20, 2023  1639 WBC(!): 20.0 [CP]    Clinical  Course User Index [CP] Olene Floss, PA-C                                 Medical Decision Making Amount and/or Complexity of Data Reviewed Labs: ordered. Decision-making details documented in ED Course. Radiology: ordered.  Risk Decision regarding hospitalization.   This patient is a 77 y.o. female who presents to the ED for concern of tachycardia, afib rvr, this involves an extensive number of treatment options, and is a complaint that carries with it a high risk of complications and morbidity. The emergent differential diagnosis prior to evaluation includes, but is  not limited to,  unstable tachydysrhythmia, unstable bradycardia, sick sinus syndrome, heart block or other abnormal electrophysiologic abnormality including WPW, LGL, brugada, SVT, afib, a flutter, vs other . This is not an exhaustive differential.   Past Medical History / Co-morbidities / Social History: hyperlipidemia, GERD, HSV infection  Additional history: Chart reviewed. Pertinent results include: Reviewed outpatient gynecology visits, no recent visits to PCP or emergency department to review  Physical Exam: Physical exam performed. The pertinent findings include: Patient overall well-appearing but in tachycardic, irregularly irregular rhythm.  She additionally has a audible at least 3/6 murmur, difficult to tell systolic versus diastolic given A-fib  Lab Tests: I ordered, and personally interpreted labs.  The pertinent results include: BMP notable for mild hyponatremia, sodium 134, moderate hyperglycemia, glucose 182, nonfasting lab value, mild elevation of troponin at 20, likely secondary to demand ischemia, low clinical suspicion for acute ACS.  Normal magnesium, BNP is mildly elevated 172.6, again likely secondary to cardiac stress related to new A-fib.  She does have New leukocytosis of unclear etiology, may be secondary to stress from new onset A-fib versus infection which could be the cause of her A-fib.   Anticipate admission either to cardiology or medicine, cardiology planning to see her on rounds.   Imaging Studies: I ordered imaging studies including plain film chest x-ray. I independently visualized and interpreted imaging which showed no evidence of acute intrathoracic abnormality. I agree with the radiologist interpretation.   Cardiac Monitoring:  The patient was maintained on a cardiac monitor.  My attending physician Dr. Rush Landmark viewed and interpreted the cardiac monitored which showed an underlying rhythm of: A-fib, RVR, new right bundle, no previous to compare. I agree with this interpretation.   Medications: I ordered medication including fluid bolus for tachycardia, she was given 10 mg of diltiazem prior to arrival  Consultations Obtained: I requested consultation with the cardiology, she was assessed at bedside by Dr. Jacinto Halim who will directly admit to cardiology at this time after discussing lab work, imaging.   Disposition: After consideration of the diagnostic results and the patients response to treatment, I feel that patient would benefit from admission to cardiology at this time for new onset A-fib with RVR.   I discussed this case with my attending physician Dr. Rush Landmark who cosigned this note including patient's presenting symptoms, physical exam, and planned diagnostics and interventions. Attending physician stated agreement with plan or made changes to plan which were implemented.    Final Clinical Impression(s) / ED Diagnoses Final diagnoses:  New onset atrial fibrillation [facility])    Rx / DC Orders ED Discharge Orders          Ordered    Amb referral to AFIB Clinic        04/20/23 1717              [place] 04/20/23 1727    Tegeler, Canary Brim, MD 04/20/23 1734

## 2023-04-20 NOTE — ED Notes (Signed)
Went to xray 

## 2023-04-20 NOTE — ED Notes (Signed)
ED TO INPATIENT HANDOFF REPORT  ED Nurse Name and Phone #: Jeannett Senior [phone]  S Name/Age/Gender Adriana Cook 77 y.o. female Room/Bed: 011C/011C  Code Status   Code Status: Full Code  Home/SNF/Other Home Patient oriented to: self, place, time, and situation Is this baseline? Yes   Triage Complete: Triage complete  Chief Complaint Atrial fibrillation with RVR [facility]) [I48.91]  Triage Note Pt coming in from [facility]. Pt lives at home alone. Pt is alert and oriented. Pt found to be in afib with a rate in the 180s by ems. Pt was given 10 of diltiazem. And pt rate returned to 140s.        Allergies Allergies  Allergen Reactions   Penicillins Rash    Level of Care/Admitting Diagnosis ED Disposition     ED Disposition  Admit   Condition  --   Comment  Hospital Area: [facility] [[id number]]  Level of Care: Telemetry Cardiac [103]  May place patient in observation at [facility] or [facility] if equivalent level of care is available:: No  Covid Evaluation: Asymptomatic - no recent exposure (last 10 days) testing not required  Diagnosis: Atrial fibrillation with RVR [facility]) [[id number]]  Admitting Physician: Yates Decamp [[id number]]  Attending Physician: Erenest Rasher          B Medical/Surgery History Past Medical History:  Diagnosis Date   Abnormal Pap smear of cervix    Hx of cryotherapy of cervix in her 47s or 30s   Atrophic vaginitis    HSV-1 (herpes simplex virus 1) infection    Osteoporosis    Past Surgical History:  Procedure Laterality Date   ANKLE ARTHROSCOPY Right 2008 ?   BREAST BIOPSY Left 06/2018   fibrocystic change and adenosis.    CATARACT EXTRACTION Right 02/14/11   CESAREAN SECTION  09/25/1980   COLONOSCOPY W/ POLYPECTOMY  12/2008   high grade gladular dysplasia recheck in 3 years   COLONOSCOPY W/ POLYPECTOMY  04/12/12   polyp X 4 tubular adenoma - recheck in 5 years.     A IV Location/Drains/Wounds Patient  Lines/Drains/Airways Status     Active Line/Drains/Airways     Name Placement date Placement time Site Days   Peripheral IV 04/20/23 20 G Left;Posterior Hand 04/20/23  1512  Hand  less than 1   Peripheral IV 04/20/23 20 G 1" Right Antecubital 04/20/23  --  Antecubital  less than 1            Intake/Output Last 24 hours No intake or output data in the 24 hours ending 04/20/23 2035  Labs/Imaging Results for orders placed or performed during the hospital encounter of 04/20/23 (from the past 48 hour(s))  CBC     Status: Abnormal   Collection Time: 04/20/23  4:01 PM  Result Value Ref Range   WBC 20.0 (H) 4.0 - 10.5 K/uL   RBC 5.01 3.87 - 5.11 MIL/uL   Hemoglobin 15.6 (H) 12.0 - 15.0 g/dL   HCT 62.1 30.8 - 65.7 %   MCV 90.2 80.0 - 100.0 fL   MCH 31.1 26.0 - 34.0 pg   MCHC 34.5 30.0 - 36.0 g/dL   RDW 84.6 96.2 - 95.2 %   Platelets 202 150 - 400 K/uL   nRBC 0.0 0.0 - 0.2 %    Comment: Performed at [facility] Lab, [address], [place], [place] [address]  Basic metabolic panel     Status: Abnormal   Collection Time: 04/20/23  4:01 PM  Result Value Ref Range   Sodium 134 (L) 135 - 145 mmol/L   Potassium 4.0 3.5 - 5.1 mmol/L   Chloride 102 98 - 111 mmol/L   CO2 21 (L) 22 - 32 mmol/L   Glucose, Bld 182 (H) 70 - 99 mg/dL    Comment: Glucose reference range applies only to samples taken after fasting for at least 8 hours.   BUN 11 8 - 23 mg/dL   Creatinine, Ser 1.61 0.44 - 1.00 mg/dL   Calcium 9.0 8.9 - 09.6 mg/dL   GFR, Estimated >04 >54 mL/min    Comment: (NOTE) Calculated using the CKD-EPI Creatinine Equation (2021)    Anion gap 11 5 - 15    Comment: Performed at [facility], [address], [place], [place] [address]  Magnesium     Status: None   Collection Time: 04/20/23  4:01 PM  Result Value Ref Range   Magnesium 1.9 1.7 - 2.4 mg/dL    Comment: Performed at [facility], [address], [place], [place] [address]  Brain natriuretic peptide      Status: Abnormal   Collection Time: 04/20/23  4:01 PM  Result Value Ref Range   B Natriuretic Peptide 172.6 (H) 0.0 - 100.0 pg/mL    Comment: Performed at [facility], [address] [place], [place] [address]  Troponin I (High Sensitivity)     Status: Abnormal   Collection Time: 04/20/23  4:01 PM  Result Value Ref Range   Troponin I (High Sensitivity) 20 (H) <18 ng/L    Comment: (NOTE) Elevated high sensitivity troponin I (hsTnI) values and significant  changes across serial measurements may suggest ACS but many other  chronic and acute conditions are known to elevate hsTnI results.  Refer to the "Links" section for chest pain algorithms and additional  guidance. Performed at [facility], [address] [place], [place] [address]   TSH     Status: None   Collection Time: 04/20/23  4:01 PM  Result Value Ref Range   TSH 2.852 0.350 - 4.500 uIU/mL    Comment: Performed by a 3rd Generation assay with a functional sensitivity of <=0.01 uIU/mL. Performed at [facility], [address], [place], [place] [address]   Urinalysis, Routine w reflex microscopic -Urine, Clean Catch     Status: Abnormal   Collection Time: 04/20/23  4:40 PM  Result Value Ref Range   Color, Urine AMBER (A) YELLOW    Comment: BIOCHEMICALS MAY BE AFFECTED BY COLOR   APPearance CLEAR CLEAR   Specific Gravity, Urine 1.015 1.005 - 1.030   pH 6.0 5.0 - 8.0   Glucose, UA NEGATIVE NEGATIVE mg/dL   Hgb urine dipstick NEGATIVE NEGATIVE   Bilirubin Urine NEGATIVE NEGATIVE   Ketones, ur 5 (A) NEGATIVE mg/dL   Protein, ur NEGATIVE NEGATIVE mg/dL   Nitrite NEGATIVE NEGATIVE   Leukocytes,Ua TRACE (A) NEGATIVE   RBC / HPF 0-5 0 - 5 RBC/hpf   WBC, UA 11-20 0 - 5 WBC/hpf   Bacteria, UA RARE (A) NONE SEEN   Squamous Epithelial / HPF 0-5 0 - 5 /HPF    Comment: Performed at [facility] Lab, [address] [place], [place] [address]  Troponin I (High Sensitivity)     Status: Abnormal   Collection  Time: 04/20/23  5:34 PM  Result Value Ref Range   Troponin I (High Sensitivity) 21 (H) <18 ng/L  Comment: (NOTE) Elevated high sensitivity troponin I (hsTnI) values and significant  changes across serial measurements may suggest ACS but many other  chronic and acute conditions are known to elevate hsTnI results.  Refer to the "Links" section for chest pain algorithms and additional  guidance. Performed at [facility], [address], [place], [place] [address]   Hemoglobin A1c     Status: Abnormal   Collection Time: 04/20/23  5:34 PM  Result Value Ref Range   Hgb A1c MFr Bld 6.0 (H) 4.8 - 5.6 %    Comment: (NOTE) Pre diabetes:          5.7%-6.4%  Diabetes:              >6.4%  Glycemic control for   <7.0% adults with diabetes    Mean Plasma Glucose 125.5 mg/dL    Comment: Performed at [facility] Lab, [address], [place], [place] [address]   DG Chest 2 View  Result Date: 04/20/2023 CLINICAL DATA:  Chest pain EXAM: CHEST - 2 VIEW COMPARISON:  None Available. FINDINGS: The heart size and mediastinal contours are within normal limits. Both lungs are clear. The visualized skeletal structures are unremarkable. IMPRESSION: No active cardiopulmonary disease. Electronically Signed   By: Alcide Clever M.D.   On: 04/20/2023 19:07    Pending Labs Unresulted Labs (From admission, onward)     Start     Ordered   04/21/23 0500  Lipid panel  Tomorrow morning,   R        04/20/23 1717            Vitals/Pain Today's Vitals   04/20/23 1912 04/20/23 1940 04/20/23 1945 04/20/23 2000  BP:  (!) 145/55 136/69 138/67  Pulse:  81 90 60  Resp:  (!) 24 19 20   Temp: 98.4 F (36.9 C)     TempSrc: Oral     SpO2:  96% 95% 97%  Weight:      Height:      PainSc:        Isolation Precautions No active isolations  Medications Medications  acetaminophen (TYLENOL) tablet 650 mg (has no administration in time range)  ondansetron (ZOFRAN) injection 4 mg (has no administration  in time range)  apixaban (ELIQUIS) tablet 5 mg (has no administration in time range)  metoprolol tartrate (LOPRESSOR) injection 2.5 mg (2.5 mg Intravenous Given 04/20/23 1805)  metoprolol tartrate (LOPRESSOR) tablet 25 mg (has no administration in time range)  diltiazem (CARDIZEM) tablet 30 mg (30 mg Oral Given 04/20/23 1835)  sodium chloride 0.9 % bolus 1,000 mL (0 mLs Intravenous Stopped 04/20/23 1715)    Mobility walks     Focused Assessments Cardiac Assessment Handoff:  Cardiac Rhythm: Atrial flutter No results found for: "CKTOTAL", "CKMB", "CKMBINDEX", "TROPONINI" No results found for: "DDIMER" Does the Patient currently have chest pain? No    R Recommendations: See Admitting Provider Note  Report given to:   Additional Notes: Pt A&Ox4, ambulatory at baseline, no pain.

## 2023-04-21 ENCOUNTER — Observation Stay (HOSPITAL_BASED_OUTPATIENT_CLINIC_OR_DEPARTMENT_OTHER): Payer: Medicare Other

## 2023-04-21 ENCOUNTER — Other Ambulatory Visit (HOSPITAL_COMMUNITY): Payer: Self-pay

## 2023-04-21 DIAGNOSIS — I739 Peripheral vascular disease, unspecified: Secondary | ICD-10-CM | POA: Diagnosis not present

## 2023-04-21 DIAGNOSIS — I480 Paroxysmal atrial fibrillation: Secondary | ICD-10-CM | POA: Diagnosis not present

## 2023-04-21 DIAGNOSIS — I4891 Unspecified atrial fibrillation: Secondary | ICD-10-CM

## 2023-04-21 DIAGNOSIS — K219 Gastro-esophageal reflux disease without esophagitis: Secondary | ICD-10-CM | POA: Diagnosis not present

## 2023-04-21 DIAGNOSIS — R739 Hyperglycemia, unspecified: Secondary | ICD-10-CM | POA: Diagnosis not present

## 2023-04-21 LAB — ECHOCARDIOGRAM COMPLETE
AR max vel: 1.26 cm2
AV Area VTI: 1.19 cm2
AV Area mean vel: 1.18 cm2
AV Mean grad: 21.8 mm[Hg]
AV Peak grad: 39.2 mm[Hg]
Ao pk vel: 3.13 m/s
Area-P 1/2: 3.17 cm2
Calc EF: 63.7 %
Height: 66 in
MV VTI: 1.84 cm2
P 1/2 time: 514 ms
S' Lateral: 2.4 cm
Single Plane A2C EF: 66.1 %
Single Plane A4C EF: 61.6 %
Weight: 2895.96 [oz_av]

## 2023-04-21 LAB — LIPID PANEL
Cholesterol: 133 mg/dL (ref 0–200)
HDL: 26 mg/dL — ABNORMAL LOW (ref >40)
LDL Cholesterol: 87 mg/dL (ref 0–99)
Total CHOL/HDL Ratio: 5.1 {ratio}
Triglycerides: 100 mg/dL (ref <150)
VLDL: 20 mg/dL (ref 0–40)

## 2023-04-21 LAB — BASIC METABOLIC PANEL
Anion gap: 12 (ref 5–15)
BUN: 13 mg/dL (ref 8–23)
CO2: 20 mmol/L — ABNORMAL LOW (ref 22–32)
Calcium: 8.7 mg/dL — ABNORMAL LOW (ref 8.9–10.3)
Chloride: 104 mmol/L (ref 98–111)
Creatinine, Ser: 0.73 mg/dL (ref 0.44–1.00)
GFR, Estimated: >60 mL/min (ref >60)
Glucose, Bld: 169 mg/dL — ABNORMAL HIGH (ref 70–99)
Potassium: 3.9 mmol/L (ref 3.5–5.1)
Sodium: 136 mmol/L (ref 135–145)

## 2023-04-21 LAB — MAGNESIUM: Magnesium: 1.9 mg/dL (ref 1.7–2.4)

## 2023-04-21 MED ORDER — MAGNESIUM SULFATE 2 GM/50ML IV SOLN
2.0000 g | Freq: Once | INTRAVENOUS | Status: AC
  Administered 2023-04-21: 2 g via INTRAVENOUS
  Filled 2023-04-21: qty 50

## 2023-04-21 MED ORDER — APIXABAN 5 MG PO TABS
5.0000 mg | ORAL_TABLET | Freq: Two times a day (BID) | ORAL | 3 refills | Status: DC
  Filled 2023-04-21: qty 60, 30d supply, fill #0

## 2023-04-21 MED ORDER — ROSUVASTATIN CALCIUM 20 MG PO TABS
40.0000 mg | ORAL_TABLET | Freq: Every day | ORAL | Status: DC
  Administered 2023-04-21: 40 mg via ORAL
  Filled 2023-04-21: qty 2

## 2023-04-21 MED ORDER — DILTIAZEM HCL ER COATED BEADS 180 MG PO CP24
180.0000 mg | ORAL_CAPSULE | Freq: Every day | ORAL | 3 refills | Status: DC
  Filled 2023-04-21: qty 30, 30d supply, fill #0

## 2023-04-21 MED ORDER — METOPROLOL TARTRATE 25 MG PO TABS
25.0000 mg | ORAL_TABLET | Freq: Two times a day (BID) | ORAL | 2 refills | Status: DC
  Filled 2023-04-21: qty 60, 30d supply, fill #0

## 2023-04-21 MED ORDER — ROSUVASTATIN CALCIUM 40 MG PO TABS
40.0000 mg | ORAL_TABLET | Freq: Every day | ORAL | 1 refills | Status: DC
  Filled 2023-04-21: qty 30, 30d supply, fill #0

## 2023-04-21 NOTE — Progress Notes (Signed)
Patient given discharge instructions, medication list and follow up appointments. Medications will be picked up from [facility] pharmacy. All questions answered will discharge home as ordered. Transported to exit via wheel chair and nursing staff. Ryot Burrous, Randall An RN

## 2023-04-21 NOTE — Progress Notes (Signed)
  Echocardiogram 2D Echocardiogram has been performed.  Adriana Cook 04/21/2023, 11:43 AM

## 2023-04-21 NOTE — Progress Notes (Signed)
   Patient Name: Adriana Cook Date of Encounter: 04/21/2023 [facility] Cardiologist: None   Interval Summary  .    She is feeling well this morning. Anxious to go home.   Vital Signs .    Vitals:   04/20/23 2333 04/21/23 0425 04/21/23 0755 04/21/23 0928  BP: (!) 136/94 130/73 (!) 138/58 122/86  Pulse: 85 65 62   Resp: 19 19 14 17   Temp: 98.6 F (37 C) 98.9 F (37.2 C) 98.4 F (36.9 C)   TempSrc: Oral Oral Oral   SpO2: 98% 98% 97%   Weight:      Height:       No intake or output data in the 24 hours ending 04/21/23 1010    04/20/2023    3:16 PM 01/14/2021   12:02 PM 01/09/2020    1:04 PM  Last 3 Weights  Weight (lbs) 181 lb 181 lb 184 lb 9.6 oz  Weight (kg) 82.1 kg 82.101 kg 83.734 kg      Telemetry/ECG    Converted to Sinus Rhythm last evening then back into atrial fibrillation early morning-->sinus rhythm - Personally Reviewed  Physical Exam .   GEN: No acute distress.   Neck: No JVD Cardiac: RRR, 2/6 systolic murmur RUSB, no rubs, or gallops.  Respiratory: Clear to auscultation bilaterally. GI: Soft, nontender, non-distended  MS: No edema  Assessment & Plan .     77 yo female with PMH of HLD, GERD who presented in new onset atrial fibrillation.   New onset atrial fibrillation -- presented with chest discomfort that she attributed to GERD and found to be in new onset atrial fibrillation with RVR -- placed on combination of diltiazem and metoprolol overnight with conversion to sinus rhythm -- now on Eliquis 5mg  BID (though copay is high 2/2 deductible) she is ok with proceeding and will follow up is cost becomes an issue -- echo pending  HLD -- LDL 85, with preDM prefer to place on statin in place of Zetia -- will add Crestor 40mg  daily  -- will need FLP/LFTs in 8 weeks  PAD -- noted to have an absent pedal pulse on the left and decreased pulse in the left popliteal artery -- pending ABIs  Cardiac Murmur -- reports being told as a child  she had a murmur but nothing sense -- suspect aortic stenosis -- echo pending  PreDM -- Hgb A1c 6  Morning labs, echo and ABI pending. Planned for likely discharge this afternoon  For questions or updates, please contact [facility] Please consult [email or web address] for contact info under        Signed, Laverda Page, NP

## 2023-04-21 NOTE — Discharge Summary (Signed)
Discharge Summary    Patient ID: Adriana Cook MRN: [id number]; DOB: 1946/02/12  Admit date: 04/20/2023 Discharge date: 04/21/2023  PCP:  Jarrett Soho, PA-C   [facility] Providers Cardiologist:  Yates Decamp, MD     Discharge Diagnoses    Principal Problem:   Atrial fibrillation with RVR [facility]) Active Problems:   New onset atrial fibrillation (HCC)   PAD (peripheral artery disease) (HCC)   Gastroesophageal reflux disease without esophagitis   Hyperglycemia   Hypercholesteremia   Paroxysmal atrial fibrillation [facility])   Diagnostic Studies/Procedures    Echo: pending at the time of discharge  _____________   History of Present Illness     Adriana Cook is a 77 y.o. female with PMH of HLD, GERD who presented in new onset atrial fibrillation. She was seen by her PCP for Dizziness and nausea that started last Saturday that is 3 days ago.  Patient had been having symptoms of GERD for almost a year but had recently switched from PPI to Pepcid which initially worked over the last 3 months she has had labs and states that she is having terrible acid reflux.     Currently not on any PPI medications.  In view of new onset atrial fibrillation with RVR and chest pain, she was advised to go to the emergency room.   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  Denied chest pain, shortness of breath, palpitations, dizziness or syncope.  No leg edema, no recent change in weight.  Hospital Course     New onset atrial fibrillation -- presented with chest discomfort that she attributed to GERD and found to be in new onset atrial fibrillation with RVR -- placed on combination of diltiazem and metoprolol with conversion to sinus rhythm -- now on Eliquis 5mg  BID (though copay is high 2/2 deductible) she is ok with proceeding and will follow up is  cost becomes an issue -- will stop metoprolol and continue Diltiazem 180mg  daily at discharge  -- echo pending read, but reviewed by MD and felt to have moderate aortic stenosis   HLD -- LDL 85, with preDM prefer to place on statin in place of Zetia -- will add Crestor 40mg  daily  -- will need FLP/LFTs in 8 weeks   PAD -- noted to have an absent pedal pulse on the left and decreased pulse in the left popliteal artery -- pending ABIs (to be completed as outpatient)   Cardiac Murmur -- echo official read but reviewed by MD with moderate aortic stenosis -- continue monitoring    PreDM -- Hgb A1c 6   Patient was seen by Dr. Jacinto Halim and deemed stable for discharge home. Follow up arranged in the office. Medications sent to [facility] pharmacy. Educated by pharmD prior to discharge.  _____________  Discharge Vitals Blood pressure 128/61, pulse (!) 58, temperature 98.4 F (36.9 C), temperature source Oral, resp. rate 20, height 5\' 6"  (1.676 m), weight 82.1 kg, last menstrual period 06/30/1996, SpO2 94%.  Filed Weights   04/20/23 1516  Weight: 82.1 kg    Labs & Radiologic Studies    CBC Recent Labs    04/20/23 1601  WBC 20.0*  HGB 15.6*  HCT 45.2  MCV 90.2  PLT 202   Basic Metabolic Panel Recent Labs    40/98/11 1601 04/21/23 1013  NA 134*  --   K 4.0  --  CL 102  --   CO2 21*  --   GLUCOSE 182*  --   BUN 11  --   CREATININE 0.80  --   CALCIUM 9.0  --   MG 1.9 1.9   Liver Function Tests No results for input(s): "AST", "ALT", "ALKPHOS", "BILITOT", "PROT", "ALBUMIN" in the last 72 hours. No results for input(s): "LIPASE", "AMYLASE" in the last 72 hours. High Sensitivity Troponin:   Recent Labs  Lab 04/20/23 1601 04/20/23 1734  TROPONINIHS 20* 21*    BNP Invalid input(s): "POCBNP" D-Dimer No results for input(s): "DDIMER" in the last 72 hours. Hemoglobin A1C Recent Labs    04/20/23 1734  HGBA1C 6.0*   Fasting Lipid Panel Recent Labs    04/21/23 0509   CHOL 133  HDL 26*  LDLCALC 87  TRIG 130  CHOLHDL 5.1   Thyroid Function Tests Recent Labs    04/20/23 1601  TSH 2.852   _____________  DG Chest 2 View  Result Date: 04/20/2023 CLINICAL DATA:  Chest pain EXAM: CHEST - 2 VIEW COMPARISON:  None Available. FINDINGS: The heart size and mediastinal contours are within normal limits. Both lungs are clear. The visualized skeletal structures are unremarkable. IMPRESSION: No active cardiopulmonary disease. Electronically Signed   By: Alcide Clever M.D.   On: 04/20/2023 19:07   Disposition   Pt is being discharged home today in good condition.  Follow-up Plans & Appointments     Follow-up Information     Sharlene Dory, PA-C Follow up on 05/08/2023.   Specialty: Cardiology Why: at 2:45pm for your follow up appt with cardiology Contact information: [address] [place] [address] [phone]                Discharge Instructions     Amb referral to AFIB Clinic   Complete by: As directed    Call MD for:  difficulty breathing, headache or visual disturbances   Complete by: As directed    Call MD for:  persistant dizziness or light-headedness   Complete by: As directed    Diet - low sodium heart healthy   Complete by: As directed    Increase activity slowly   Complete by: As directed         Discharge Medications   Allergies as of 04/21/2023       Reactions   Penicillins Rash        Medication List     STOP taking these medications    ezetimibe 10 MG tablet Commonly known as: ZETIA       TAKE these medications    apixaban 5 MG Tabs tablet Commonly known as: ELIQUIS Take 1 tablet (5 mg total) by mouth 2 (two) times daily.   Calcium 500 + D 500-200 MG-UNIT Tabs Generic drug: Calcium Carb-Cholecalciferol Take 1 tablet by mouth daily.   diltiazem 180 MG 24 hr capsule Commonly known as: Cardizem CD Take 1 capsule (180 mg total) by mouth daily.   famotidine 20 MG  tablet Commonly known as: PEPCID Take 20 mg by mouth daily.   LUTEIN 20 PO Take 1 capsule by mouth daily.   MULTI-VITAMIN DAILY PO Take by mouth daily.   pantoprazole 20 MG tablet Commonly known as: PROTONIX Take 20 mg by mouth daily.   rosuvastatin 40 MG tablet Commonly known as: CRESTOR Take 1 tablet (40 mg total) by mouth daily. Start taking on: April 22, 2023   valACYclovir 1000 MG tablet Commonly known as:  Valtrex Take 2 tablets (2000 mg) by mouth every 12 hours for 24 hours as needed. What changed:  how much to take how to take this when to take this reasons to take this   VITAMIN K2-VITAMIN D3 PO Take 1 capsule by mouth daily.         Outstanding Labs/Studies   FLP/LFTs in 8 weeks   Duration of Discharge Encounter   Greater than 30 minutes including physician time.  Signed, Laverda Page, NP 04/21/2023, 12:55 PM

## 2023-04-21 NOTE — TOC Benefit Eligibility Note (Signed)
Patient Product/process development scientist completed.    The patient is insured through [facility]. Patient has Medicare and is not eligible for a copay card, but may be able to apply for patient assistance, if available.    Ran test claim for Eliquis 5 mg and the current 30 day co-pay is $474.20 due to a deductible.   This test claim was processed through [facility]- copay amounts may vary at other pharmacies due to pharmacy/plan contracts, or as the patient moves through the different stages of their insurance plan.     Roland Earl, CPHT Pharmacy Technician III Certified Patient Advocate [facility] Patient Advocate Team Direct Number: [phone]  Fax: [phone]

## 2023-04-23 NOTE — H&P (Signed)
Adriana Decamp, MD Physician Cardiology   Progress Notes    Addendum   Date of Service: 04/20/2023  4:32 PM   Expand All Collapse All      Patient Name: Adriana Cook Date of Encounter: 04/20/2023 [facility] Cardiologist: None    Interval Summary  .     Adriana Cook is a  Very active Caucasian female patient with no significant prior cardiovascular history, not on any cardiac medications except for ezetimibe, hyperglycemia and GERD was seen by her PCP for Dizziness and nausea that started last Saturday that is 3 days ago.  Patient has been having symptoms of GERD for almost a year but had recently switched from PPI to Pepcid which initially worked over the last 3 months she has had labs and states that she is having terrible acid reflux.     Currently not on any PPI medications.  In view of new onset atrial fibrillation with RVR and chest pain, she was advised to go to the emergency room.   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  Denies chest pain, shortness of breath, palpitations, dizziness or syncope.  No leg edema, no recent change in weight.   Vital Signs .           Vitals:    04/20/23 1600 04/20/23 1630 04/20/23 1645 04/20/23 1700  BP: 126/80 124/89 119/62 134/86  Pulse: (!) 105 (!) 54 (!) 127 (!) 135  Resp: 15 15 (!) 27 18  SpO2: 98% 98% 90% 97%  Weight:          Height:            No intake or output data in the 24 hours ending 04/20/23 1710     04/20/2023    3:16 PM 01/14/2021   12:02 PM 01/09/2020    1:04 PM  Last 3 Weights  Weight (lbs) 181 lb 181 lb 184 lb 9.6 oz  Weight (kg) 82.1 kg 82.101 kg 83.734 kg     Review of Systems  Cardiovascular:  Negative for chest pain, dyspnea on exertion and leg swelling.  Musculoskeletal:  Positive for muscle cramps (both legs at night).  Neurological:  Positive for  dizziness.    Physical Exam .   Physical Exam Neck:     Vascular: No carotid bruit or JVD.  Cardiovascular:     Rate and Rhythm: Tachycardia present. Rhythm irregular.     Pulses:          Femoral pulses are 2+ on the right side and 2+ on the left side.      Popliteal pulses are 2+ on the right side and 1+ on the left side.       Dorsalis pedis pulses are 2+ on the right side and 0 on the left side.       Posterior tibial pulses are 2+ on the right side and 0 on the left side.     Heart sounds: Murmur heard.     Harsh early systolic murmur is present with a grade of 2/6 at the upper right sternal border and apex.  Pulmonary:     Effort: Pulmonary effort is normal.     Breath sounds: Normal breath sounds.  Abdominal:     General: Bowel sounds are normal.     Palpations: Abdomen is soft.  Musculoskeletal:     Right  lower leg: No edema.     Left lower leg: No edema.  Skin:    Capillary Refill: Capillary refill takes less than 2 seconds.      Labs    Recent Labs       Lab Results  Component Value Date    NA 134 (L) 04/20/2023    K 4.0 04/20/2023    CO2 21 (L) 04/20/2023    GLUCOSE 182 (H) 04/20/2023    BUN 11 04/20/2023    CREATININE 0.80 04/20/2023    CALCIUM 9.0 04/20/2023    GFRNONAA >60 04/20/2023      Recent Labs       Lab Results  Component Value Date    WBC 20.0 (H) 04/20/2023    HGB 15.6 (H) 04/20/2023    HCT 45.2 04/20/2023    MCV 90.2 04/20/2023    PLT 202 04/20/2023      Cardiac Panel (last 3 results) Troponin (Point of Care Test) Recent Labs (last 2 labs)  No results for input(s): "TROPIPOC" in the last 72 hours.     Cardiac Panel (last 3 results) Recent Labs (last 2 labs)     Recent Labs    04/20/23 1601  TROPONINIHS 20*        CHA2DS2-VASc Score is 3.  Yearly risk of stroke: 3.2% (A, F).  Score of 1=0.6; 2=2.2; 3=3.2; 4=4.8; 5=7.2; 6=9.8; 7=>9.8) -(CHF; HTN; vasc disease DM,  Female = 1; Age <65 =0; 65-74 = 1,  >75 =2; stroke/embolism=  2).     Telemetry/ECG    EKG 04/20/2023: Atrial fibrillation with rapid ventricular response at rate of 124 bpm, normal axis.  Right bundle branch block.  No ST-T wave changes of ischemia. - Personally Reviewed     Assessment & Plan .     1.  New onset atrial fibrillation with rapid ventricular response, duration unknown. Recommendation: Will admit the patient for observation/management of atrial fibrillation with RVR.  Her blood pressure is soft, she has no history of hypertension, I will use low-dose combination of beta-blocker and calcium channel blocker orally as she is completely asymptomatic and there is no clinical evidence of heart failure and there is no evidence of ischemia by EKG in spite of RVR.  Suspicion for CAD is low.  Cardiac markers are pending.   CHA2DS2-VASc score is 3.0, she will need to be on anticoagulation.  Will obtain echocardiogram.   2.  GERD Symptoms are well-controlled on PPI, since being on Pepcid she has had recurrence of acid reflux ongoing for the past 6 months especially last 3 months.  No change in symptoms of GERD hence do not suspect angina or anginal equivalent.   3.  Heart murmur, suspect mild aortic stenosis or aortic sclerosis.      4.  Hypercholesterolemia In view of hyperglycemia, age, I would also prefer to be on a statin, will check lipid profile testing, will continue Zetia for now.   5.  Peripheral arterial disease Her physical examination reveals absent pedal pulses on the left lower extremity and decreased pulse in the left popliteal artery otherwise normal.  Will obtain ABI to establish disease process for risk stratification.   If her urinalysis, labs are unremarkable, potentially can be discharged home tomorrow with outpatient follow-up.   For questions or updates, please contact [facility] Please consult [email or web address] for contact info under       Adriana Decamp, MD, [facility] 04/20/2023, 5:10 PM [facility] [address]  [address] [place], [place] [address] Phone: [phone]. Fax:  [phone] Jacinto Halim, MD, [facility] 04/20/2023, 5:10 PM     Revision History

## 2023-04-28 DIAGNOSIS — R7303 Prediabetes: Secondary | ICD-10-CM | POA: Diagnosis not present

## 2023-04-28 DIAGNOSIS — Z09 Encounter for follow-up examination after completed treatment for conditions other than malignant neoplasm: Secondary | ICD-10-CM | POA: Diagnosis not present

## 2023-04-28 DIAGNOSIS — E782 Mixed hyperlipidemia: Secondary | ICD-10-CM | POA: Diagnosis not present

## 2023-04-28 DIAGNOSIS — I480 Paroxysmal atrial fibrillation: Secondary | ICD-10-CM | POA: Diagnosis not present

## 2023-04-28 DIAGNOSIS — Z6830 Body mass index (BMI) 30.0-30.9, adult: Secondary | ICD-10-CM | POA: Diagnosis not present

## 2023-04-28 DIAGNOSIS — R011 Cardiac murmur, unspecified: Secondary | ICD-10-CM | POA: Diagnosis not present

## 2023-04-28 DIAGNOSIS — I739 Peripheral vascular disease, unspecified: Secondary | ICD-10-CM | POA: Diagnosis not present

## 2023-05-08 ENCOUNTER — Ambulatory Visit: Payer: Medicare Other | Admitting: Physician Assistant

## 2023-05-16 ENCOUNTER — Other Ambulatory Visit (HOSPITAL_COMMUNITY): Payer: Self-pay

## 2023-05-18 ENCOUNTER — Other Ambulatory Visit: Payer: Self-pay | Admitting: Cardiology

## 2023-05-18 NOTE — Telephone Encounter (Signed)
Pt has pending appt in Jan 2025 with Jari Favre, ok to fill under [facility] name even though he only has seen in hosp, not in office yet. Please advise.

## 2023-05-18 NOTE — Telephone Encounter (Signed)
OK to fill.  Prescription sent to pharmacy

## 2023-07-13 NOTE — Progress Notes (Signed)
 Cardiology Office Note:  .   Date:  07/15/2023  ID:  Derotha Fletcher, DOB 1945-08-21, MRN [id number] PCP: Sander Crooked, NP  [facility] Providers Cardiologist:  Knox Perl, MD {  History of Present Illness: .   Adriana Cook is a 78 y.o. female with a past medical history of HLD, GERD, and new onset atrial fibrillation (with recent hospitalization) here for follow-up visit.  Patient was admitted 04/20/2023 and was seen 3 days prior for dizziness and nausea that started the Saturday before.  Patient had been having symptoms of GERD for almost a year but recently switched from PPI to Pepcid which initially worked over the last 3 months or so but then unfortunately was still having terrible acid reflux.  Was not on any PPI medication when seen.  New onset atrial fibrillation with RVR and chest pain at that time so she would go to the ER for further evaluation.  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  Denied chest pain, shortness of breath, palpitations, dizziness, and syncope.  No leg edema and no recent change in weight.  Was diagnosed with new onset atrial fibrillation and placed on a combination of diltiazem  and metoprolol  with conversion to sinus rhythm.  Started on Eliquis  5 mg twice daily echocardiogram was also ordered.  She was felt to have moderate aortic stenosis.  Started on Crestor  to further reduce LDL.  Plan for repeat lipid panel roughly in December.  Today, she presents with a history of AFib and moderate aortic stenosis with ongoing nausea leading to a significant weight loss of twenty pounds. The nausea is persistent throughout the day and has resulted in a decreased appetite. The patient also reports a constant feeling of slight nausea. The patient has been experiencing these symptoms since starting on new medications, including diltiazem , Eliquis , and rosuvastatin .  However, she is unsure which medication might be causing the nausea.  In addition to the nausea, the patient reports poor sleep patterns, waking up every two hours at night. The patient is unsure if the need to urinate wakes her up or if she wakes up for other reasons and then decides to urinate. The patient also reports a decrease in energy levels, which she attributes to poor sleep and inadequate nutrition due to the ongoing nausea.  The patient also mentions a decrease in pulse in the left leg, which was noted during a previous vascular exam. However, she reports no pain with walking. The patient has been managing her acid reflux with Protonix , which she has stopped taking due to the nausea. The acid reflux symptoms have subsided, but the nausea persists  Reports no shortness of breath nor dyspnea on exertion. Reports no chest pain, pressure, or tightness. No edema, orthopnea, PND. Reports no palpitations.   Discussed the use of AI scribe software for clinical note transcription with the patient, who gave verbal consent to proceed.  ROS: Pertinent ROS in HPI  Studies Reviewed: .       Echocardiogram 04/20/2023 IMPRESSIONS     1. Left ventricular ejection fraction, by estimation, is 60 to 65%. The  left ventricle has normal function. The left ventricle has no regional  wall motion abnormalities. Left ventricular diastolic parameters are  indeterminate.   2. Right ventricular systolic function is normal. The right ventricular  size is normal. There is normal pulmonary artery systolic pressure. The  estimated  right ventricular systolic pressure is 29.2 mmHg.   3. Left atrial size was moderately dilated.   4. Right atrial size was mildly dilated.   5. The mitral valve is degenerative. Mild mitral valve regurgitation. No  evidence of mitral stenosis. The mean mitral valve gradient is 3.0 mmHg.  Severe mitral annular calcification.   6. Tricuspid valve regurgitation is mild to moderate.    7. The aortic valve is calcified. There is moderate calcification of the  aortic valve. There is moderate thickening of the aortic valve. Aortic  valve regurgitation is mild. Moderate aortic valve stenosis. Aortic  regurgitation PHT measures 514 msec.  Aortic valve area, by VTI measures 1.19 cm. Aortic valve mean gradient  measures 21.8 mmHg. Aortic valve Vmax measures 3.13 m/s.   8. The inferior vena cava is dilated in size with >50% respiratory  variability, suggesting right atrial pressure of 8 mmHg.   Comparison(s): No prior Echocardiogram.   FINDINGS   Left Ventricle: Left ventricular ejection fraction, by estimation, is 60  to 65%. The left ventricle has normal function. The left ventricle has no  regional wall motion abnormalities. The left ventricular internal cavity  size was normal in size. There is   no left ventricular hypertrophy. Left ventricular diastolic parameters  are indeterminate.   Right Ventricle: The right ventricular size is normal. No increase in  right ventricular wall thickness. Right ventricular systolic function is  normal. There is normal pulmonary artery systolic pressure. The tricuspid  regurgitant velocity is 2.30 m/s, and   with an assumed right atrial pressure of 8 mmHg, the estimated right  ventricular systolic pressure is 29.2 mmHg.   Left Atrium: Left atrial size was moderately dilated.   Right Atrium: Right atrial size was mildly dilated.   Pericardium: There is no evidence of pericardial effusion.   Mitral Valve: The mitral valve is degenerative in appearance. Severe  mitral annular calcification. Mild mitral valve regurgitation. No evidence  of mitral valve stenosis. MV peak gradient, 7.6 mmHg. The mean mitral  valve gradient is 3.0 mmHg.   Tricuspid Valve: The tricuspid valve is normal in structure. Tricuspid  valve regurgitation is mild to moderate. No evidence of tricuspid  stenosis.   Aortic Valve: The aortic valve is calcified.  There is moderate  calcification of the aortic valve. There is moderate thickening of the  aortic valve. Aortic valve regurgitation is mild. Aortic regurgitation PHT  measures 514 msec. Moderate aortic stenosis  is present. Aortic valve mean gradient measures 21.8 mmHg. Aortic valve  peak gradient measures 39.2 mmHg. Aortic valve area, by VTI measures 1.19  cm.   Pulmonic Valve: The pulmonic valve was normal in structure. Pulmonic valve  regurgitation is not visualized. No evidence of pulmonic stenosis.   Aorta: The aortic root and ascending aorta are structurally normal, with  no evidence of dilitation.   Venous: The inferior vena cava is dilated in size with greater than 50%  respiratory variability, suggesting right atrial pressure of 8 mmHg.   IAS/Shunts: No atrial level shunt detected by color flow Doppler.   Risk Assessment/Calculations:    CHA2DS2-VASc Score = 3   This indicates a 3.2% annual risk of stroke. The patient's score is based upon: CHF History: 0 HTN History: 0 Diabetes History: 0 Stroke History: 0 Vascular Disease History: 0 Age Score: 2 Gender Score: 1           Physical Exam:   VS:  BP 132/74   Pulse 81  Ht 5\' 6"  (1.676 m)   Wt 164 lb 3.2 oz (74.5 kg)   LMP 06/30/1996   SpO2 96%   BMI 26.50 kg/m    Wt Readings from Last 3 Encounters:  07/15/23 164 lb 3.2 oz (74.5 kg)  04/20/23 181 lb (82.1 kg)  01/14/21 181 lb (82.1 kg)    GEN: Well nourished, well developed in no acute distress NECK: No JVD; No carotid bruits CARDIAC: RRR, + 4/6 systolic murmurs, rubs, gallops RESPIRATORY:  Clear to auscultation without rales, wheezing or rhonchi  ABDOMEN: Soft, non-tender, non-distended EXTREMITIES:  No edema; No deformity   ASSESSMENT AND PLAN: .    Atrial Fibrillation (onset Oct 2024) Stable on Diltiazem  180mg  daily and Eliquis . No palpitations or dizziness reported. -Take Eliquis  with food to potentially alleviate nausea. -Consider switching to  Xarelto if nausea persists. -Continue Diltiazem  180mg  daily.  Nausea and Weight Loss Significant weight loss due to persistent nausea. Unclear etiology, but possible side effect of Eliquis . -Take Eliquis  with food. -Consider switching to Xarelto if nausea persists.  Gastroesophageal Reflux Disease (GERD) Improved with Protonix . -Continue Protonix  as needed for reflux symptoms.  Hyperlipidemia LDL 87, goal <70 due to aortic valve calcification. -Continue Rosuvastatin . -Consider adding Zetia in the future to further reduce LDL. -Switch Rosuvastatin  to evening dosing. -Lipid panel and LFTs at [facility] location  Aortic Stenosis Moderate stenosis noted on recent echocardiogram. -Return for follow-up in 1 year with repeat echocardiogram. -Report any symptoms of lightheadedness, dizziness, or shortness of breath.  Insomnia Difficulty falling and staying asleep. -Try Benadryl 25mg  at bedtime. -Consider reducing fluid intake in the evening to decrease nocturia.  Peripheral Vascular Disease Decreased pulse in left leg. -Schedule Ankle-Brachial Index (ABI) testing at [facility] location.       Dispo: She can follow-up in 2-3 months with Dr. Berry Bristol  Signed, Von Grumbling, PA-C

## 2023-07-15 ENCOUNTER — Ambulatory Visit: Payer: Medicare Other | Attending: Physician Assistant | Admitting: Physician Assistant

## 2023-07-15 ENCOUNTER — Encounter: Payer: Self-pay | Admitting: Physician Assistant

## 2023-07-15 VITALS — BP 132/74 | HR 81 | Ht 66.0 in | Wt 164.2 lb

## 2023-07-15 DIAGNOSIS — E7800 Pure hypercholesterolemia, unspecified: Secondary | ICD-10-CM | POA: Insufficient documentation

## 2023-07-15 DIAGNOSIS — I350 Nonrheumatic aortic (valve) stenosis: Secondary | ICD-10-CM | POA: Diagnosis not present

## 2023-07-15 DIAGNOSIS — R0989 Other specified symptoms and signs involving the circulatory and respiratory systems: Secondary | ICD-10-CM | POA: Insufficient documentation

## 2023-07-15 DIAGNOSIS — K219 Gastro-esophageal reflux disease without esophagitis: Secondary | ICD-10-CM | POA: Diagnosis not present

## 2023-07-15 DIAGNOSIS — I4891 Unspecified atrial fibrillation: Secondary | ICD-10-CM | POA: Diagnosis not present

## 2023-07-15 MED ORDER — PANTOPRAZOLE SODIUM 20 MG PO TBEC: 20.0000 mg | DELAYED_RELEASE_TABLET | Freq: Every day | ORAL | 11 refills | Status: AC | PRN

## 2023-07-15 MED ORDER — ROSUVASTATIN CALCIUM 40 MG PO TABS: 40.0000 mg | ORAL_TABLET | Freq: Every evening | ORAL | 1 refills | Status: DC

## 2023-07-15 MED ORDER — APIXABAN 5 MG PO TABS: 5.0000 mg | ORAL_TABLET | Freq: Two times a day (BID) | ORAL | 3 refills | Status: DC

## 2023-07-15 NOTE — Patient Instructions (Signed)
 Medication Instructions:  Protonix  dose instructions have changed to daily as needed. Crestor  instructions have changed to take in the evening. Both scripts have been updated and sent to pharmacy. *If you need a refill on your cardiac medications before your next appointment, please call your pharmacy*   Lab Work: LFT, Lipid panel (fasting) to be drawn at [facility] clinic on day of ultrasound testing.  If you have labs (blood work) drawn today and your tests are completely normal, you will receive your results only by: MyChart Message (if you have MyChart) OR A paper copy in the mail If you have any lab test that is abnormal or we need to change your treatment, we will call you to review the results.   Testing/Procedures: Your physician has requested that you have an ankle brachial index (ABI). During this test an ultrasound and blood pressure cuff are used to evaluate the arteries that supply the arms and legs with blood. Allow thirty minutes for this exam. There are no restrictions or special instructions. This will take place at [address], [address].    Please note: We ask at that you not bring children with you during ultrasound (echo/ vascular) testing. Due to room size and safety concerns, children are not allowed in the ultrasound rooms during exams. Our front office staff cannot provide observation of children in our lobby area while testing is being conducted. An adult accompanying a patient to their appointment will only be allowed in the ultrasound room at the discretion of the ultrasound technician under special circumstances. We apologize for any inconvenience.   Your physician has requested that you have a lower or upper extremity arterial duplex. This test is an ultrasound of the arteries in the legs or arms. It looks at arterial blood flow in the legs and arms. Allow one hour for Lower and Upper Arterial scans. There are no restrictions or special instructions.  Please  note: We ask at that you not bring children with you during ultrasound (echo/ vascular) testing. Due to room size and safety concerns, children are not allowed in the ultrasound rooms during exams. Our front office staff cannot provide observation of children in our lobby area while testing is being conducted. An adult accompanying a patient to their appointment will only be allowed in the ultrasound room at the discretion of the ultrasound technician under special circumstances. We apologize for any inconvenience.   Follow-Up: At [facility], you and your health needs are our priority.  As part of our continuing mission to provide you with exceptional heart care, we have created designated Provider Care Teams.  These Care Teams include your primary Cardiologist (physician) and Advanced Practice Providers (APPs -  Physician Assistants and Nurse Practitioners) who all work together to provide you with the care you need, when you need it.  We recommend signing up for the patient portal called "MyChart".  Sign up information is provided on this After Visit Summary.  MyChart is used to connect with patients for Virtual Visits (Telemedicine).  Patients are able to view lab/test results, encounter notes, upcoming appointments, etc.  Non-urgent messages can be sent to your provider as well.   To learn more about what you can do with MyChart, go to [email or web address].    Your next appointment:   3 month(s)  Provider:   Knox Perl, MD

## 2023-08-07 ENCOUNTER — Ambulatory Visit (HOSPITAL_COMMUNITY)
Admission: RE | Admit: 2023-08-07 | Discharge: 2023-08-07 | Disposition: A | Discharge status: Home / Self Care | Payer: Medicare Other | Source: Ambulatory Visit | Attending: Cardiology | Admitting: Cardiology

## 2023-08-07 ENCOUNTER — Encounter: Payer: Self-pay | Admitting: Physician Assistant

## 2023-08-07 ENCOUNTER — Ambulatory Visit (HOSPITAL_BASED_OUTPATIENT_CLINIC_OR_DEPARTMENT_OTHER)
Admission: RE | Admit: 2023-08-07 | Discharge: 2023-08-07 | Disposition: A | Discharge status: Home / Self Care | Payer: Medicare Other | Source: Ambulatory Visit | Attending: Physician Assistant | Admitting: Physician Assistant

## 2023-08-07 DIAGNOSIS — R0989 Other specified symptoms and signs involving the circulatory and respiratory systems: Secondary | ICD-10-CM | POA: Insufficient documentation

## 2023-08-07 LAB — VAS US ABI WITH/WO TBI
Left ABI: 0.59
Right ABI: 1.14

## 2023-08-08 NOTE — Progress Notes (Signed)
 I do PV so please schedule her to see me. Not everyone is aware of this. Will send reminders to all

## 2023-08-17 NOTE — Progress Notes (Unsigned)
Cardiology Office Note:  .   Date:  08/18/2023  ID:  Adriana Cook, DOB 08/14/1945, MRN [id number] PCP: Murlean Iba, NP  [facility] Providers Cardiologist:  Yates Decamp, MD   History of Present Illness: .   Adriana Cook is a 78 y.o. 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    Patient was started on Eliquis and Crestor and discharged home with outpatient follow-up.  She was evaluated by Jari Favre, PA-C and found to have severe peripheral artery disease with left popliteal artery occlusion and abnormal ABI that was performed on 08/07/2023 and now presents to discuss further options.  She denies symptoms of claudication.  No open wounds or bad cramps.  Discussed the use of AI scribe software for clinical note transcription with the patient, who gave verbal consent to proceed.  History of Present Illness   The patient, with a history of atrial fibrillation and hyperlipidemia, presents with insomnia. She reports that the issue has been present for a while but has worsened significantly in the past few months, to the point of being debilitating after several nights of poor sleep. She has tried Benadryl and melatonin, which help her fall asleep but not stay asleep.  She also reports occasional dizziness upon waking, which resolves on its own. She has been experiencing some nausea due to not taking her medications with food, but this has improved since she started doing so. She has lost a significant amount of weight and has a reduced appetite.  The patient has a sedentary job as an Airline pilot and reports walking about 2-3 miles a day, aiming for 2-3  thousand steps. She does not experience any pain, cramping, or weakness in her legs. She has a known blockage in her leg but reports no discomfort.  She has a history of acid reflux, which has been well-controlled with Protonix taken as needed. She has not needed to take this medication in several weeks.      Labs   Lab Results  Component Value Date   CHOL 133 04/21/2023   HDL 26 (L) 04/21/2023   LDLCALC 87 04/21/2023   TRIG 100 04/21/2023   CHOLHDL 5.1 04/21/2023   Lab Results  Component Value Date   NA 136 04/21/2023   K 3.9 04/21/2023   CO2 20 (L) 04/21/2023   GLUCOSE 169 (H) 04/21/2023   BUN 13 04/21/2023   CREATININE 0.73 04/21/2023   CALCIUM 8.7 (L) 04/21/2023   GFRNONAA >60 04/21/2023      Latest Ref Rng & Units 04/21/2023   10:13 AM 04/20/2023    4:01 PM 12/05/2015    3:42 PM  BMP  Glucose 70 - 99 mg/dL 086  578  469   BUN 8 - 23 mg/dL 13  11  11    Creatinine 0.44 - 1.00 mg/dL 6.29  5.28  4.13   Sodium 135 - 145 mmol/L 136  134  141   Potassium 3.5 - 5.1 mmol/L 3.9  4.0  4.4   Chloride 98 - 111 mmol/L 104  102  105   CO2 22 - 32 mmol/L 20  21  23    Calcium 8.9 - 10.3 mg/dL 8.7  9.0  9.1  Latest Ref Rng & Units 04/20/2023    4:01 PM 12/05/2015    3:42 PM 09/08/2013    1:35 PM  CBC  WBC 4.0 - 10.5 K/uL 20.0  7.8    Hemoglobin 12.0 - 15.0 g/dL 16.1  09.6  04.5   Hematocrit 36.0 - 46.0 % 45.2  46.5    Platelets 150 - 400 K/uL 202  261     Lab Results  Component Value Date   HGBA1C 6.0 (H) 04/20/2023    Lab Results  Component Value Date   TSH 2.852 04/20/2023    Review of Systems  Cardiovascular:  Negative for chest pain, dyspnea on exertion and leg swelling.   Physical Exam:   VS:  BP (!) 155/80 (BP Location: Left Arm, Patient Position: Sitting, Cuff Size: Normal)   Pulse 71   Resp 16   Ht 5\' 6"  (1.676 m)   Wt 160 lb (72.6 kg)   LMP 06/30/1996   SpO2 97%   BMI 25.82 kg/m    Wt Readings from Last 3 Encounters:  08/18/23 160 lb (72.6 kg)   07/15/23 164 lb 3.2 oz (74.5 kg)  04/20/23 181 lb (82.1 kg)    Physical Exam Neck:     Vascular: Carotid bruit (bilateral bruit) present. No JVD.  Cardiovascular:     Rate and Rhythm: Normal rate and regular rhythm.     Pulses:          Popliteal pulses are 2+ on the right side and 0 on the left side.       Dorsalis pedis pulses are 2+ on the right side and 0 on the left side.       Posterior tibial pulses are 2+ on the right side and 0 on the left side.     Heart sounds: S1 normal and S2 normal. Murmur heard.     Midsystolic murmur is present with a grade of 3/6 at the upper right sternal border radiating to the neck.     No gallop.  Pulmonary:     Effort: Pulmonary effort is normal.     Breath sounds: Normal breath sounds.  Abdominal:     General: Bowel sounds are normal.     Palpations: Abdomen is soft.  Musculoskeletal:     Right lower leg: No edema.     Left lower leg: No edema.    Studies Reviewed: Marland Kitchen    ECHOCARDIOGRAM COMPLETE 04/21/2023  1. Left ventricular ejection fraction, by estimation, is 60 to 65%. The left ventricle has normal function. The left ventricle has no regional wall motion abnormalities. Left ventricular diastolic parameters are indeterminate. 2. Right ventricular systolic function is normal. The right ventricular size is normal. There is normal pulmonary artery systolic pressure. The estimated right ventricular systolic pressure is 29.2 mmHg. 3. Left atrial size was moderately dilated. 4. Right atrial size was mildly dilated. 5. The mitral valve is degenerative. Mild mitral valve regurgitation. No evidence of mitral stenosis. The mean mitral valve gradient is 3.0 mmHg. Severe mitral annular calcification. 6. Tricuspid valve regurgitation is mild to moderate. 7. The aortic valve is calcified. There is moderate calcification of the aortic valve. There is moderate thickening of the aortic valve. Aortic valve regurgitation is mild. Moderate aortic valve  stenosis. Aortic regurgitation PHT measures 514 msec. Aortic valve area, by VTI measures 1.19 cm. Aortic valve mean gradient measures 21.8 mmHg. Aortic valve Vmax measures 3.13 m/s. 8. The inferior vena cava is dilated in size with >50% respiratory  variability, suggesting right atrial pressure of 8 mmHg.  Lower Extremity Arterial Duplex 08/07/2023: Right lower extremity triphasic waveforms throughout.  Right ABI 1.14, normal. Left lower extremity left distal SFA monophasic waveform, left proximal popliteal occluded with monophasic waveform at the level of the ankle.  Left ABI 0.59, moderately reduced.  EKG:    EKG Interpretation Date/Time:  Tuesday August 18 2023 09:36:56 EST Ventricular Rate:  66 PR Interval:  220 QRS Duration:  122 QT Interval:  432 QTC Calculation: 452 R Axis:   -2  Text Interpretation: EKG 08/18/2023: Sinus rhythm with first-degree AV block at the rate of 66 bpm, normal axis, right bundle branch block.  Compared to 04/20/2023, A-fib with RVR not present. Confirmed by Delrae Rend [phone]) on 08/18/2023 9:58:05 AM    EKG 04/20/2023: Atrial fibrillation with rapid ventricular sponsor at the rate of 124 bpm, right bundle branch block.  Nonspecific T abnormality.  Medications and allergies    Allergies  Allergen Reactions   Penicillins Rash     Current Outpatient Medications:    apixaban (ELIQUIS) 5 MG TABS tablet, Take 1 tablet (5 mg total) by mouth 2 (two) times daily., Disp: 180 tablet, Rfl: 3   Calcium Carb-Cholecalciferol (CALCIUM 500 + D) 500-200 MG-UNIT TABS, Take 1 tablet by mouth daily., Disp: , Rfl:    diltiazem (CARDIZEM CD) 180 MG 24 hr capsule, Take 1 capsule (180 mg total) by mouth daily., Disp: 90 capsule, Rfl: 3   Misc Natural Products (LUTEIN 20 PO), Take 1 capsule by mouth daily., Disp: , Rfl:    Multiple Vitamin (MULTI-VITAMIN DAILY PO), Take by mouth daily., Disp: , Rfl:    pantoprazole (PROTONIX) 20 MG tablet, Take 1 tablet (20 mg total)  by mouth daily as needed., Disp: 30 tablet, Rfl: 11   rosuvastatin (CRESTOR) 40 MG tablet, Take 1 tablet (40 mg total) by mouth every evening., Disp: 90 tablet, Rfl: 1   traZODone (DESYREL) 50 MG tablet, Take 1 tablet (50 mg total) by mouth at bedtime., Disp: 30 tablet, Rfl: 0   valACYclovir (VALTREX) 1000 MG tablet, Take 2 tablets (2000 mg) by mouth every 12 hours for 24 hours as needed. (Patient taking differently: Take 2,000 mg by mouth as needed (for fever blisters). Take 2 tablets (2000 mg) by mouth every 12 hours for 24 hours as needed.), Disp: 30 tablet, Rfl: 2   Vitamin D-Vitamin K (VITAMIN K2-VITAMIN D3 PO), Take 1 capsule by mouth daily., Disp: , Rfl:    alendronate (FOSAMAX) 70 MG tablet, Take 70 mg by mouth once a week. (Patient not taking: Reported on 08/18/2023), Disp: , Rfl:    ASSESSMENT AND PLAN: .      ICD-10-CM   1. Peripheral artery disease (HCC)  I73.9 EKG 12-Lead    2. Paroxysmal atrial fibrillation (HCC)  I48.0     3. Hypercholesteremia  E78.00 Lipid Profile    Hepatic function panel    4. Primary insomnia  F51.01 traZODone (DESYREL) 50 MG tablet    5. Bilateral carotid bruits  R09.89 VAS US CAROTID      1. Peripheral artery disease (HCC) (Primary) Patient with peripheral artery disease with clearly she has absent left popliteal pulse and left pedal pulses however her capillary refill is well-preserved.  She remains asymptomatic and denies even cramping at night.  However she is markedly sedentary.  She will continue to walk and exercise starting now for at least 20 to 30 minutes daily and if she develops symptoms of claudication then  we will consider intervention otherwise continue medical therapy. - EKG 12-Lead  2. Paroxysmal atrial fibrillation (HCC) She is maintaining sinus rhythm, tolerating Eliquis without bleeding diathesis. Click Here to Calculate/Change CHADS2VASc Score The patient's CHADS2-VASc score is 4, indicating a 4.8% annual risk of stroke.   Therefore, anticoagulation is recommended.   CHF History: No HTN History: No Diabetes History: No Stroke History: No Vascular Disease History: Yes   3. Hypercholesteremia Patient has severe peripheral artery disease, LDL goal is <70.  She is presently on Crestor 40 mg daily, will check lipids today.  4. Primary insomnia I prescribed her trazodone 50 mg daily, she could increase to 100 mg daily if not effective but will ask her PCP for further refills. - traZODone (DESYREL) 50 MG tablet; Take 1 tablet (50 mg total) by mouth at bedtime.  Dispense: 30 tablet; Refill: 0  5. Bilateral carotid bruits She has moderate aortic stenosis.  She also has bilateral carotid bruit which I suspect is conducted sounds from aortic stenosis however in view of underlying peripheral artery disease, would like to obtain carotid duplex.  I will see him back in 6 months for follow-up.  As she has not had any chest pain or dyspnea, EKG does not reveal any ischemia, in the absence of symptoms of angina although she has peripheral arterial disease, will continue to treat her aggressively with risk modification including treatment of hyperglycemia, hyperlipidemia and see how she does.  Do not think she needs stress testing.  This is a complex office visit, discussions regarding management of PAD, management of atrial fibrillation and lipids.  Discussions about vascular disease in general and primary/secondary prevention discussed in length as well.  Other orders - alendronate (FOSAMAX) 70 MG tablet; Take 70 mg by mouth once a week. (Patient not taking: Reported on 08/18/2023)   Signed,  Yates Decamp, MD, [facility] 08/18/2023, 8:02 PM [facility] [address] [place], [place] [address] Phone: [phone]. Fax:  [phone]

## 2023-08-18 ENCOUNTER — Ambulatory Visit: Payer: Medicare Other | Admitting: Cardiovascular Disease

## 2023-08-18 ENCOUNTER — Ambulatory Visit: Payer: Medicare Other | Attending: Cardiology | Admitting: Cardiology

## 2023-08-18 ENCOUNTER — Encounter: Payer: Self-pay | Admitting: Cardiology

## 2023-08-18 VITALS — BP 155/80 | HR 71 | Resp 16 | Ht 66.0 in | Wt 160.0 lb

## 2023-08-18 DIAGNOSIS — I739 Peripheral vascular disease, unspecified: Secondary | ICD-10-CM

## 2023-08-18 DIAGNOSIS — R0989 Other specified symptoms and signs involving the circulatory and respiratory systems: Secondary | ICD-10-CM

## 2023-08-18 DIAGNOSIS — F5101 Primary insomnia: Secondary | ICD-10-CM

## 2023-08-18 DIAGNOSIS — I480 Paroxysmal atrial fibrillation: Secondary | ICD-10-CM | POA: Diagnosis not present

## 2023-08-18 DIAGNOSIS — E7800 Pure hypercholesterolemia, unspecified: Secondary | ICD-10-CM

## 2023-08-18 MED ORDER — TRAZODONE HCL 50 MG PO TABS: 50.0000 mg | ORAL_TABLET | Freq: Every day | ORAL | 0 refills | Status: AC

## 2023-08-18 NOTE — Patient Instructions (Addendum)
Medication Instructions:  Start trazodone 50 mg by mouth daily at bedtime  *If you need a refill on your cardiac medications before your next appointment, please call your pharmacy*   Lab Work: Have lab work (lipid and liver profiles) checked today at [facility].  There is an office on the first floor of our building If you have labs (blood work) drawn today and your tests are completely normal, you will receive your results only by: MyChart Message (if you have MyChart) OR A paper copy in the mail If you have any lab test that is abnormal or we need to change your treatment, we will call you to review the results.   Testing/Procedures: Your physician has requested that you have a carotid duplex. This test is an ultrasound of the carotid arteries in your neck. It looks at blood flow through these arteries that supply the brain with blood. Allow one hour for this exam. There are no restrictions or special instructions.    Follow-Up: At [facility], you and your health needs are our priority.  As part of our continuing mission to provide you with exceptional heart care, we have created designated Provider Care Teams.  These Care Teams include your primary Cardiologist (physician) and Advanced Practice Providers (APPs -  Physician Assistants and Nurse Practitioners) who all work together to provide you with the care you need, when you need it.  We recommend signing up for the patient portal called "MyChart".  Sign up information is provided on this After Visit Summary.  MyChart is used to connect with patients for Virtual Visits (Telemedicine).  Patients are able to view lab/test results, encounter notes, upcoming appointments, etc.  Non-urgent messages can be sent to your provider as well.   To learn more about what you can do with MyChart, go to [email or web address].    Your next appointment:   6 month(s)  Provider:   Yates Decamp, MD     Other Instructions

## 2023-08-19 ENCOUNTER — Other Ambulatory Visit: Payer: Self-pay | Admitting: Cardiology

## 2023-08-19 ENCOUNTER — Encounter: Payer: Self-pay | Admitting: Physician Assistant

## 2023-08-19 DIAGNOSIS — R7989 Other specified abnormal findings of blood chemistry: Secondary | ICD-10-CM

## 2023-08-19 LAB — HEPATIC FUNCTION PANEL
ALT: 68 [IU]/L — ABNORMAL HIGH (ref 0–32)
AST: 59 [IU]/L — ABNORMAL HIGH (ref 0–40)
Albumin: 4.2 g/dL (ref 3.8–4.8)
Alkaline Phosphatase: 213 [IU]/L — ABNORMAL HIGH (ref 44–121)
Bilirubin Total: 0.8 mg/dL (ref 0.0–1.2)
Bilirubin, Direct: 0.27 mg/dL (ref 0.00–0.40)
Total Protein: 6.2 g/dL (ref 6.0–8.5)

## 2023-08-19 LAB — LIPID PANEL
Chol/HDL Ratio: 3.3 {ratio} (ref 0.0–4.4)
Cholesterol, Total: 124 mg/dL (ref 100–199)
HDL: 38 mg/dL — ABNORMAL LOW (ref >39)
LDL Chol Calc (NIH): 64 mg/dL (ref 0–99)
Triglycerides: 121 mg/dL (ref 0–149)
VLDL Cholesterol Cal: 22 mg/dL (ref 5–40)

## 2023-08-19 NOTE — Progress Notes (Signed)
I reviewed your cholesterol panel, lipids are well-controlled.  Your LFTs have slightly increased but I am not too concerned as you are asymptomatic.  I have placed orders for you to recheck lipids in 1 month, please go to [facility], no fasting is needed approximately 4 weeks.

## 2023-09-14 ENCOUNTER — Encounter: Payer: Self-pay | Admitting: Cardiology

## 2023-09-14 ENCOUNTER — Ambulatory Visit (HOSPITAL_COMMUNITY)
Admission: RE | Admit: 2023-09-14 | Discharge: 2023-09-14 | Disposition: A | Discharge status: Home / Self Care | Payer: Medicare Other | Source: Ambulatory Visit | Attending: Cardiology | Admitting: Cardiology

## 2023-09-14 ENCOUNTER — Other Ambulatory Visit: Payer: Self-pay | Admitting: Cardiology

## 2023-09-14 DIAGNOSIS — R0989 Other specified symptoms and signs involving the circulatory and respiratory systems: Secondary | ICD-10-CM | POA: Diagnosis not present

## 2023-09-14 DIAGNOSIS — F5101 Primary insomnia: Secondary | ICD-10-CM

## 2023-09-14 NOTE — Progress Notes (Signed)
 Very minimal plaque in the carotids. No further evaluation needed

## 2023-09-14 NOTE — Telephone Encounter (Signed)
 Pt's pharmacy is requesting a refill on medication trazodone. Would Dr. Jacinto Halim like to refill this non cardiac medication? Please address

## 2023-09-15 NOTE — Telephone Encounter (Signed)
 Please advise as prescribing name on script

## 2023-09-15 NOTE — Telephone Encounter (Signed)
 Please send to PCP  From last office note- 4. Primary insomnia I prescribed her trazodone 50 mg daily, she could increase to 100 mg daily if not effective but will ask her PCP for further refills. - traZODone (DESYREL) 50 MG tablet; Take 1 tablet (50 mg total) by mouth at bedtime.  Dispense: 30 tablet; Refill: 0

## 2023-09-21 DIAGNOSIS — I480 Paroxysmal atrial fibrillation: Secondary | ICD-10-CM | POA: Diagnosis not present

## 2023-09-21 DIAGNOSIS — R011 Cardiac murmur, unspecified: Secondary | ICD-10-CM | POA: Diagnosis not present

## 2023-09-21 DIAGNOSIS — K219 Gastro-esophageal reflux disease without esophagitis: Secondary | ICD-10-CM | POA: Diagnosis not present

## 2023-09-21 DIAGNOSIS — Z6825 Body mass index (BMI) 25.0-25.9, adult: Secondary | ICD-10-CM | POA: Diagnosis not present

## 2023-09-21 DIAGNOSIS — E782 Mixed hyperlipidemia: Secondary | ICD-10-CM | POA: Diagnosis not present

## 2023-09-21 DIAGNOSIS — F5104 Psychophysiologic insomnia: Secondary | ICD-10-CM | POA: Diagnosis not present

## 2023-09-21 DIAGNOSIS — R748 Abnormal levels of other serum enzymes: Secondary | ICD-10-CM | POA: Diagnosis not present

## 2023-09-21 DIAGNOSIS — I739 Peripheral vascular disease, unspecified: Secondary | ICD-10-CM | POA: Diagnosis not present

## 2023-09-21 DIAGNOSIS — R7303 Prediabetes: Secondary | ICD-10-CM | POA: Diagnosis not present

## 2023-09-21 DIAGNOSIS — M810 Age-related osteoporosis without current pathological fracture: Secondary | ICD-10-CM | POA: Diagnosis not present

## 2023-10-19 ENCOUNTER — Ambulatory Visit: Payer: Medicare Other | Admitting: Cardiology

## 2023-10-22 DIAGNOSIS — K760 Fatty (change of) liver, not elsewhere classified: Secondary | ICD-10-CM | POA: Diagnosis not present

## 2023-10-22 DIAGNOSIS — K5730 Diverticulosis of large intestine without perforation or abscess without bleeding: Secondary | ICD-10-CM | POA: Diagnosis not present

## 2023-10-22 DIAGNOSIS — R634 Abnormal weight loss: Secondary | ICD-10-CM | POA: Diagnosis not present

## 2023-10-22 DIAGNOSIS — Z860100 Personal history of colon polyps, unspecified: Secondary | ICD-10-CM | POA: Diagnosis not present

## 2023-10-22 DIAGNOSIS — R748 Abnormal levels of other serum enzymes: Secondary | ICD-10-CM | POA: Diagnosis not present

## 2023-10-28 DIAGNOSIS — I480 Paroxysmal atrial fibrillation: Secondary | ICD-10-CM | POA: Diagnosis not present

## 2023-10-28 DIAGNOSIS — E782 Mixed hyperlipidemia: Secondary | ICD-10-CM | POA: Diagnosis not present

## 2023-10-28 DIAGNOSIS — I4891 Unspecified atrial fibrillation: Secondary | ICD-10-CM | POA: Diagnosis not present

## 2023-12-17 DIAGNOSIS — K760 Fatty (change of) liver, not elsewhere classified: Secondary | ICD-10-CM | POA: Diagnosis not present

## 2023-12-17 DIAGNOSIS — K5730 Diverticulosis of large intestine without perforation or abscess without bleeding: Secondary | ICD-10-CM | POA: Diagnosis not present

## 2023-12-17 DIAGNOSIS — R748 Abnormal levels of other serum enzymes: Secondary | ICD-10-CM | POA: Diagnosis not present

## 2023-12-17 DIAGNOSIS — K219 Gastro-esophageal reflux disease without esophagitis: Secondary | ICD-10-CM | POA: Diagnosis not present

## 2024-01-25 DIAGNOSIS — Z1231 Encounter for screening mammogram for malignant neoplasm of breast: Secondary | ICD-10-CM | POA: Diagnosis not present

## 2024-01-25 LAB — HM MAMMOGRAPHY

## 2024-01-26 ENCOUNTER — Encounter: Payer: Self-pay | Admitting: Obstetrics and Gynecology

## 2024-01-26 ENCOUNTER — Ambulatory Visit: Payer: Self-pay | Admitting: Obstetrics and Gynecology

## 2024-02-19 DIAGNOSIS — D2271 Melanocytic nevi of right lower limb, including hip: Secondary | ICD-10-CM | POA: Diagnosis not present

## 2024-02-19 DIAGNOSIS — L820 Inflamed seborrheic keratosis: Secondary | ICD-10-CM | POA: Diagnosis not present

## 2024-02-19 DIAGNOSIS — L570 Actinic keratosis: Secondary | ICD-10-CM | POA: Diagnosis not present

## 2024-02-19 DIAGNOSIS — L719 Rosacea, unspecified: Secondary | ICD-10-CM | POA: Diagnosis not present

## 2024-02-19 DIAGNOSIS — L821 Other seborrheic keratosis: Secondary | ICD-10-CM | POA: Diagnosis not present

## 2024-02-19 DIAGNOSIS — Z85828 Personal history of other malignant neoplasm of skin: Secondary | ICD-10-CM | POA: Diagnosis not present

## 2024-02-19 DIAGNOSIS — D229 Melanocytic nevi, unspecified: Secondary | ICD-10-CM | POA: Diagnosis not present

## 2024-02-19 DIAGNOSIS — L578 Other skin changes due to chronic exposure to nonionizing radiation: Secondary | ICD-10-CM | POA: Diagnosis not present

## 2024-02-19 DIAGNOSIS — D2261 Melanocytic nevi of right upper limb, including shoulder: Secondary | ICD-10-CM | POA: Diagnosis not present

## 2024-03-22 DIAGNOSIS — Z1331 Encounter for screening for depression: Secondary | ICD-10-CM | POA: Diagnosis not present

## 2024-03-22 DIAGNOSIS — Z0000 Encounter for general adult medical examination without abnormal findings: Secondary | ICD-10-CM | POA: Diagnosis not present

## 2024-04-04 DIAGNOSIS — K219 Gastro-esophageal reflux disease without esophagitis: Secondary | ICD-10-CM | POA: Diagnosis not present

## 2024-04-04 DIAGNOSIS — E782 Mixed hyperlipidemia: Secondary | ICD-10-CM | POA: Diagnosis not present

## 2024-04-04 DIAGNOSIS — I739 Peripheral vascular disease, unspecified: Secondary | ICD-10-CM | POA: Diagnosis not present

## 2024-04-04 DIAGNOSIS — I480 Paroxysmal atrial fibrillation: Secondary | ICD-10-CM | POA: Diagnosis not present

## 2024-04-04 DIAGNOSIS — I4891 Unspecified atrial fibrillation: Secondary | ICD-10-CM | POA: Diagnosis not present

## 2024-04-04 DIAGNOSIS — E1165 Type 2 diabetes mellitus with hyperglycemia: Secondary | ICD-10-CM | POA: Diagnosis not present

## 2024-04-04 DIAGNOSIS — M810 Age-related osteoporosis without current pathological fracture: Secondary | ICD-10-CM | POA: Diagnosis not present

## 2024-04-04 DIAGNOSIS — F5104 Psychophysiologic insomnia: Secondary | ICD-10-CM | POA: Diagnosis not present

## 2024-04-13 ENCOUNTER — Other Ambulatory Visit: Payer: Self-pay | Admitting: Physician Assistant

## 2024-04-13 DIAGNOSIS — E7800 Pure hypercholesterolemia, unspecified: Secondary | ICD-10-CM

## 2024-04-13 MED ORDER — ROSUVASTATIN CALCIUM 40 MG PO TABS: 40.0000 mg | ORAL_TABLET | Freq: Every evening | ORAL | 0 refills | Status: DC

## 2024-05-30 ENCOUNTER — Ambulatory Visit: Admitting: Cardiology

## 2024-06-06 NOTE — Telephone Encounter (Signed)
 Resolved in telephone note

## 2024-06-07 NOTE — Progress Notes (Unsigned)
 Cardiology Office Note:  .   Date:  06/07/2024  ID:  Adriana Cook, DOB 05-Oct-1945, MRN [id number] PCP: Adriana Alan DASEN, NP  [facility] Providers Cardiologist:  Gordy Bergamo, MD {  History of Present Illness: .   Adriana Cook is a 78 y.o. female with a past medical history of HLD, GERD, and new onset atrial fibrillation (with recent hospitalization) here for follow-up visit.  Patient was admitted 04/20/2023 and was seen 3 days prior for dizziness and nausea that started the Saturday before.  Patient had been having symptoms of GERD for almost a year but recently switched from PPI to Pepcid which initially worked over the last 3 months or so but then unfortunately was still having terrible acid reflux.  Was not on any PPI medication when seen.  New onset atrial fibrillation with RVR and chest pain at that time so she would go to the ER for further evaluation.  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  Denied chest pain, shortness of breath, palpitations, dizziness, and syncope.  No leg edema and no recent change in weight.  Was diagnosed with new onset atrial fibrillation and placed on a combination of diltiazem  and metoprolol  with conversion to sinus rhythm.  Started on Eliquis  5 mg twice daily echocardiogram was also ordered.  She was felt to have moderate aortic stenosis.  Started on Crestor  to further reduce LDL.  Plan for repeat lipid panel roughly in December.  I saw her 07/2023, she presents with a history of AFib and moderate aortic stenosis with ongoing nausea leading to a significant weight loss of twenty pounds. The nausea is persistent throughout the day and has resulted in a decreased appetite. The patient also reports a constant feeling of slight nausea. The patient has been experiencing these symptoms since starting on new medications, including diltiazem , Eliquis , and  rosuvastatin . However, she is unsure which medication might be causing the nausea.  In addition to the nausea, the patient reports poor sleep patterns, waking up every two hours at night. The patient is unsure if the need to urinate wakes her up or if she wakes up for other reasons and then decides to urinate. The patient also reports a decrease in energy levels, which she attributes to poor sleep and inadequate nutrition due to the ongoing nausea.  The patient also mentions a decrease in pulse in the left leg, which was noted during a previous vascular exam. However, she reports no pain with walking. The patient has been managing her acid reflux with Protonix , which she has stopped taking due to the nausea. The acid reflux symptoms have subsided, but the nausea persists  Reports no shortness of breath nor dyspnea on exertion. Reports no chest pain, pressure, or tightness. No edema, orthopnea, PND. Reports no palpitations.   Discussed the use of AI scribe software for clinical note transcription with the patient, who gave verbal consent to proceed.  Today, she ***  ROS: Pertinent ROS in HPI  Studies Reviewed: .       Echocardiogram 04/20/2023 IMPRESSIONS     1. Left ventricular ejection fraction, by estimation, is 60 to 65%. The  left ventricle has normal function. The left ventricle has no regional  wall motion abnormalities. Left ventricular diastolic parameters are  indeterminate.   2. Right ventricular systolic function is normal. The right ventricular  size is normal. There is normal  pulmonary artery systolic pressure. The  estimated right ventricular systolic pressure is 29.2 mmHg.   3. Left atrial size was moderately dilated.   4. Right atrial size was mildly dilated.   5. The mitral valve is degenerative. Mild mitral valve regurgitation. No  evidence of mitral stenosis. The mean mitral valve gradient is 3.0 mmHg.  Severe mitral annular calcification.   6. Tricuspid valve  regurgitation is mild to moderate.   7. The aortic valve is calcified. There is moderate calcification of the  aortic valve. There is moderate thickening of the aortic valve. Aortic  valve regurgitation is mild. Moderate aortic valve stenosis. Aortic  regurgitation PHT measures 514 msec.  Aortic valve area, by VTI measures 1.19 cm. Aortic valve mean gradient  measures 21.8 mmHg. Aortic valve Vmax measures 3.13 m/s.   8. The inferior vena cava is dilated in size with >50% respiratory  variability, suggesting right atrial pressure of 8 mmHg.   Comparison(s): No prior Echocardiogram.   FINDINGS   Left Ventricle: Left ventricular ejection fraction, by estimation, is 60  to 65%. The left ventricle has normal function. The left ventricle has no  regional wall motion abnormalities. The left ventricular internal cavity  size was normal in size. There is   no left ventricular hypertrophy. Left ventricular diastolic parameters  are indeterminate.   Right Ventricle: The right ventricular size is normal. No increase in  right ventricular wall thickness. Right ventricular systolic function is  normal. There is normal pulmonary artery systolic pressure. The tricuspid  regurgitant velocity is 2.30 m/s, and   with an assumed right atrial pressure of 8 mmHg, the estimated right  ventricular systolic pressure is 29.2 mmHg.   Left Atrium: Left atrial size was moderately dilated.   Right Atrium: Right atrial size was mildly dilated.   Pericardium: There is no evidence of pericardial effusion.   Mitral Valve: The mitral valve is degenerative in appearance. Severe  mitral annular calcification. Mild mitral valve regurgitation. No evidence  of mitral valve stenosis. MV peak gradient, 7.6 mmHg. The mean mitral  valve gradient is 3.0 mmHg.   Tricuspid Valve: The tricuspid valve is normal in structure. Tricuspid  valve regurgitation is mild to moderate. No evidence of tricuspid  stenosis.   Aortic  Valve: The aortic valve is calcified. There is moderate  calcification of the aortic valve. There is moderate thickening of the  aortic valve. Aortic valve regurgitation is mild. Aortic regurgitation PHT  measures 514 msec. Moderate aortic stenosis  is present. Aortic valve mean gradient measures 21.8 mmHg. Aortic valve  peak gradient measures 39.2 mmHg. Aortic valve area, by VTI measures 1.19  cm.   Pulmonic Valve: The pulmonic valve was normal in structure. Pulmonic valve  regurgitation is not visualized. No evidence of pulmonic stenosis.   Aorta: The aortic root and ascending aorta are structurally normal, with  no evidence of dilitation.   Venous: The inferior vena cava is dilated in size with greater than 50%  respiratory variability, suggesting right atrial pressure of 8 mmHg.   IAS/Shunts: No atrial level shunt detected by color flow Doppler.   Risk Assessment/Calculations:    CHA2DS2-VASc Score = 4   This indicates a 4.8% annual risk of stroke. The patient's score is based upon: CHF History: 0 HTN History: 0 Diabetes History: 0 Stroke History: 0 Vascular Disease History: 1 Age Score: 2 Gender Score: 1   No BP recorded.  {Refresh Note OR Click here to enter BP  :1}***  Physical Exam:   VS:  LMP 06/30/1996    Wt Readings from Last 3 Encounters:  08/18/23 160 lb (72.6 kg)  07/15/23 164 lb 3.2 oz (74.5 kg)  04/20/23 181 lb (82.1 kg)    GEN: Well nourished, well developed in no acute distress NECK: No JVD; No carotid bruits CARDIAC: RRR, + 4/6 systolic murmurs, rubs, gallops RESPIRATORY:  Clear to auscultation without rales, wheezing or rhonchi  ABDOMEN: Soft, non-tender, non-distended EXTREMITIES:  No edema; No deformity   ASSESSMENT AND PLAN: .    Atrial Fibrillation (onset Oct 2024) Stable on Diltiazem  180mg  daily and Eliquis . No palpitations or dizziness reported. -Take Eliquis  with food to potentially alleviate nausea. -Consider switching to Xarelto  if nausea persists. -Continue Diltiazem  180mg  daily.  Nausea and Weight Loss Significant weight loss due to persistent nausea. Unclear etiology, but possible side effect of Eliquis . -Take Eliquis  with food. -Consider switching to Xarelto if nausea persists.  Gastroesophageal Reflux Disease (GERD) Improved with Protonix . -Continue Protonix  as needed for reflux symptoms.  Hyperlipidemia LDL 87, goal <70 due to aortic valve calcification. -Continue Rosuvastatin . -Consider adding Zetia in the future to further reduce LDL. -Switch Rosuvastatin  to evening dosing. -Lipid panel and LFTs at [facility] location  Aortic Stenosis Moderate stenosis noted on recent echocardiogram. -Return for follow-up in 1 year with repeat echocardiogram. -Report any symptoms of lightheadedness, dizziness, or shortness of breath.  Insomnia Difficulty falling and staying asleep. -Try Benadryl 25mg  at bedtime. -Consider reducing fluid intake in the evening to decrease nocturia.  Peripheral Vascular Disease Decreased pulse in left leg. -Schedule Ankle-Brachial Index (ABI) testing at [facility] location.       Dispo: She can follow-up in 2-3 months with Dr. Ladona  Signed, Orren LOISE Fabry, PA-C

## 2024-06-09 ENCOUNTER — Ambulatory Visit: Attending: Physician Assistant | Admitting: Physician Assistant

## 2024-06-09 ENCOUNTER — Encounter: Payer: Self-pay | Admitting: Physician Assistant

## 2024-06-09 VITALS — BP 118/72 | HR 68 | Ht 66.0 in | Wt 141.0 lb

## 2024-06-09 DIAGNOSIS — F5101 Primary insomnia: Secondary | ICD-10-CM

## 2024-06-09 DIAGNOSIS — E7800 Pure hypercholesterolemia, unspecified: Secondary | ICD-10-CM

## 2024-06-09 DIAGNOSIS — I350 Nonrheumatic aortic (valve) stenosis: Secondary | ICD-10-CM | POA: Diagnosis present

## 2024-06-09 DIAGNOSIS — I739 Peripheral vascular disease, unspecified: Secondary | ICD-10-CM | POA: Diagnosis present

## 2024-06-09 DIAGNOSIS — I4891 Unspecified atrial fibrillation: Secondary | ICD-10-CM | POA: Insufficient documentation

## 2024-06-09 DIAGNOSIS — K219 Gastro-esophageal reflux disease without esophagitis: Secondary | ICD-10-CM | POA: Diagnosis present

## 2024-06-09 DIAGNOSIS — R0989 Other specified symptoms and signs involving the circulatory and respiratory systems: Secondary | ICD-10-CM

## 2024-06-09 DIAGNOSIS — I480 Paroxysmal atrial fibrillation: Secondary | ICD-10-CM | POA: Diagnosis present

## 2024-06-09 MED ORDER — APIXABAN 5 MG PO TABS: 5.0000 mg | ORAL_TABLET | Freq: Two times a day (BID) | ORAL | 3 refills | Status: AC

## 2024-06-09 MED ORDER — DILTIAZEM HCL ER COATED BEADS 180 MG PO CP24: 180.0000 mg | ORAL_CAPSULE | Freq: Every day | ORAL | 3 refills | Status: AC

## 2024-06-09 NOTE — Patient Instructions (Signed)
 Thank you for choosing [facility]!     Medication Instructions:  Your refills have been sent to your pharmacy.  *If you need a refill on your cardiac medications before your next appointment, please call your pharmacy*   Lab Work: No labs were ordered during today's visit.  If you have labs (blood work) drawn today and your tests are completely normal, you will receive your results only by: MyChart Message (if you have MyChart) OR A paper copy in the mail If you have any lab test that is abnormal or we need to change your treatment, we will call you to review the results.   Testing/Procedures: Your physician has requested that you have an echocardiogram in January 2026. Echocardiography is a painless test that uses sound waves to create images of your heart. It provides your doctor with information about the size and shape of your heart and how well your hearts chambers and valves are working. This procedure takes approximately one hour. There are no restrictions for this procedure. Please do NOT wear cologne, perfume, aftershave, or lotions (deodorant is allowed). Please arrive 15 minutes prior to your appointment time.  Please note: We ask at that you not bring children with you during ultrasound (echo/ vascular) testing. Due to room size and safety concerns, children are not allowed in the ultrasound rooms during exams. Our front office staff cannot provide observation of children in our lobby area while testing is being conducted. An adult accompanying a patient to their appointment will only be allowed in the ultrasound room at the discretion of the ultrasound technician under special circumstances. We apologize for any inconvenience.   Your next appointment:   1 year(s)   Provider:   Orren Fabry, PA-C           Follow-Up: At [facility], you and your health needs are our priority.  As part of our continuing mission to provide you with exceptional heart  care, we have created designated Provider Care Teams.  These Care Teams include your primary Cardiologist (physician) and Advanced Practice Providers (APPs -  Physician Assistants and Nurse Practitioners) who all work together to provide you with the care you need, when you need it. We recommend signing up for the patient portal called MyChart.  Sign up information is provided on this After Visit Summary.  MyChart is used to connect with patients for Virtual Visits (Telemedicine).  Patients are able to view lab/test results, encounter notes, upcoming appointments, etc.  Non-urgent messages can be sent to your provider as well.   To learn more about what you can do with MyChart, go to [email or web address].

## 2024-06-14 DIAGNOSIS — E1165 Type 2 diabetes mellitus with hyperglycemia: Secondary | ICD-10-CM | POA: Diagnosis not present

## 2024-06-15 DIAGNOSIS — Z23 Encounter for immunization: Secondary | ICD-10-CM | POA: Diagnosis not present

## 2024-07-21 ENCOUNTER — Ambulatory Visit (HOSPITAL_COMMUNITY)
Admission: RE | Admit: 2024-07-21 | Discharge: 2024-07-21 | Disposition: A | Discharge status: Home / Self Care | Source: Ambulatory Visit | Attending: Physician Assistant | Admitting: Physician Assistant

## 2024-07-21 DIAGNOSIS — I350 Nonrheumatic aortic (valve) stenosis: Secondary | ICD-10-CM | POA: Diagnosis present

## 2024-07-21 LAB — ECHOCARDIOGRAM COMPLETE
AR max vel: 0.75 cm2
AV Area VTI: 0.8 cm2
AV Area mean vel: 0.75 cm2
AV Mean grad: 35 mmHg
AV Peak grad: 60.7 mmHg
Ao pk vel: 3.9 m/s
Area-P 1/2: 2.46 cm2
P 1/2 time: 169 ms
S' Lateral: 2.81 cm

## 2024-07-23 ENCOUNTER — Ambulatory Visit: Payer: Self-pay | Admitting: Physician Assistant

## 2024-07-23 NOTE — Progress Notes (Signed)
 If symptomatic she should be referred. If stable and no symptoms or CHF then the valve can be observed. She is close to replacement with Dimensionless index of 0.265 which is close to severe at < 0.25.
